# Patient Record
Sex: Female | Born: 1965 | Race: Asian | Hispanic: No | Marital: Married | Smoking: Never smoker
Health system: Southern US, Community
[De-identification: ages and names within clinical notes are randomized; demographics above are authoritative.]

## PROBLEM LIST (undated history)

## (undated) DIAGNOSIS — E039 Hypothyroidism, unspecified: Secondary | ICD-10-CM

## (undated) DIAGNOSIS — D649 Anemia, unspecified: Secondary | ICD-10-CM

## (undated) DIAGNOSIS — B019 Varicella without complication: Secondary | ICD-10-CM

## (undated) DIAGNOSIS — E785 Hyperlipidemia, unspecified: Secondary | ICD-10-CM

## (undated) DIAGNOSIS — Z87440 Personal history of urinary (tract) infections: Secondary | ICD-10-CM

## (undated) DIAGNOSIS — D569 Thalassemia, unspecified: Secondary | ICD-10-CM

## (undated) DIAGNOSIS — J45909 Unspecified asthma, uncomplicated: Secondary | ICD-10-CM

## (undated) DIAGNOSIS — Z9229 Personal history of other drug therapy: Secondary | ICD-10-CM

## (undated) HISTORY — DX: Varicella without complication: B01.9

## (undated) HISTORY — DX: Personal history of other drug therapy: Z92.29

## (undated) HISTORY — PX: WISDOM TOOTH EXTRACTION: SHX21

## (undated) HISTORY — DX: Thalassemia, unspecified: D56.9

## (undated) HISTORY — DX: Hyperlipidemia, unspecified: E78.5

## (undated) HISTORY — DX: Unspecified asthma, uncomplicated: J45.909

## (undated) HISTORY — DX: Personal history of urinary (tract) infections: Z87.440

## (undated) HISTORY — DX: Anemia, unspecified: D64.9

---

## 1998-06-29 DIAGNOSIS — E039 Hypothyroidism, unspecified: Secondary | ICD-10-CM

## 1998-06-29 HISTORY — DX: Hypothyroidism, unspecified: E03.9

## 1999-03-28 ENCOUNTER — Other Ambulatory Visit: Admission: RE | Admit: 1999-03-28 | Discharge: 1999-03-28 | Payer: Self-pay | Admitting: Obstetrics and Gynecology

## 2004-04-24 ENCOUNTER — Other Ambulatory Visit: Admission: RE | Admit: 2004-04-24 | Discharge: 2004-04-24 | Payer: Self-pay | Admitting: Obstetrics and Gynecology

## 2006-10-22 ENCOUNTER — Other Ambulatory Visit: Admission: RE | Admit: 2006-10-22 | Discharge: 2006-10-22 | Payer: Self-pay | Admitting: Family Medicine

## 2008-10-08 ENCOUNTER — Other Ambulatory Visit: Admission: RE | Admit: 2008-10-08 | Discharge: 2008-10-08 | Payer: Self-pay | Admitting: Family Medicine

## 2008-10-31 ENCOUNTER — Encounter: Admission: RE | Admit: 2008-10-31 | Discharge: 2008-10-31 | Payer: Self-pay | Admitting: General Practice

## 2009-12-31 ENCOUNTER — Encounter: Admission: RE | Admit: 2009-12-31 | Discharge: 2009-12-31 | Payer: Self-pay | Admitting: Family Medicine

## 2010-02-26 ENCOUNTER — Other Ambulatory Visit: Admission: RE | Admit: 2010-02-26 | Discharge: 2010-02-26 | Payer: Self-pay | Admitting: Family Medicine

## 2011-10-01 ENCOUNTER — Other Ambulatory Visit: Payer: Self-pay | Admitting: Family Medicine

## 2011-10-01 DIAGNOSIS — Z1231 Encounter for screening mammogram for malignant neoplasm of breast: Secondary | ICD-10-CM

## 2011-10-28 ENCOUNTER — Ambulatory Visit
Admission: RE | Admit: 2011-10-28 | Discharge: 2011-10-28 | Disposition: A | Discharge status: Home / Self Care | Payer: BC Managed Care – PPO | Source: Ambulatory Visit | Attending: Family Medicine | Admitting: Family Medicine

## 2011-10-28 DIAGNOSIS — Z1231 Encounter for screening mammogram for malignant neoplasm of breast: Secondary | ICD-10-CM

## 2013-05-15 ENCOUNTER — Other Ambulatory Visit: Payer: Self-pay

## 2013-05-15 DIAGNOSIS — Z1231 Encounter for screening mammogram for malignant neoplasm of breast: Secondary | ICD-10-CM

## 2013-06-15 ENCOUNTER — Ambulatory Visit
Admission: RE | Admit: 2013-06-15 | Discharge: 2013-06-15 | Disposition: A | Discharge status: Home / Self Care | Payer: BC Managed Care – PPO | Source: Ambulatory Visit

## 2013-06-15 DIAGNOSIS — Z1231 Encounter for screening mammogram for malignant neoplasm of breast: Secondary | ICD-10-CM

## 2013-12-12 ENCOUNTER — Other Ambulatory Visit: Payer: Self-pay | Admitting: Obstetrics and Gynecology

## 2014-01-30 ENCOUNTER — Other Ambulatory Visit: Payer: Self-pay | Admitting: Obstetrics and Gynecology

## 2014-01-30 DIAGNOSIS — R102 Pelvic and perineal pain: Secondary | ICD-10-CM

## 2014-01-31 ENCOUNTER — Ambulatory Visit
Admission: RE | Admit: 2014-01-31 | Discharge: 2014-01-31 | Disposition: A | Discharge status: Home / Self Care | Payer: BC Managed Care – PPO | Source: Ambulatory Visit | Attending: Obstetrics and Gynecology | Admitting: Obstetrics and Gynecology

## 2014-01-31 DIAGNOSIS — R102 Pelvic and perineal pain: Secondary | ICD-10-CM

## 2014-01-31 MED ORDER — IOHEXOL 300 MG/ML  SOLN
100.0000 mL | Freq: Once | INTRAMUSCULAR | Status: AC | PRN
  Administered 2014-01-31: 100 mL via INTRAVENOUS

## 2014-02-20 ENCOUNTER — Other Ambulatory Visit: Payer: Self-pay | Admitting: Obstetrics and Gynecology

## 2014-03-07 ENCOUNTER — Encounter (HOSPITAL_COMMUNITY): Payer: Self-pay | Admitting: Pharmacist

## 2014-03-13 ENCOUNTER — Other Ambulatory Visit: Payer: Self-pay | Admitting: Obstetrics and Gynecology

## 2014-03-14 ENCOUNTER — Encounter (HOSPITAL_COMMUNITY): Payer: Self-pay

## 2014-03-14 ENCOUNTER — Encounter (HOSPITAL_COMMUNITY)
Admission: RE | Admit: 2014-03-14 | Discharge: 2014-03-14 | Disposition: A | Discharge status: Home / Self Care | Payer: BC Managed Care – PPO | Source: Ambulatory Visit | Attending: Obstetrics and Gynecology | Admitting: Obstetrics and Gynecology

## 2014-03-14 DIAGNOSIS — N949 Unspecified condition associated with female genital organs and menstrual cycle: Secondary | ICD-10-CM | POA: Insufficient documentation

## 2014-03-14 DIAGNOSIS — N839 Noninflammatory disorder of ovary, fallopian tube and broad ligament, unspecified: Secondary | ICD-10-CM | POA: Diagnosis not present

## 2014-03-14 DIAGNOSIS — Z01812 Encounter for preprocedural laboratory examination: Secondary | ICD-10-CM | POA: Diagnosis present

## 2014-03-14 DIAGNOSIS — R971 Elevated cancer antigen 125 [CA 125]: Secondary | ICD-10-CM | POA: Diagnosis not present

## 2014-03-14 DIAGNOSIS — D259 Leiomyoma of uterus, unspecified: Secondary | ICD-10-CM | POA: Diagnosis not present

## 2014-03-14 DIAGNOSIS — N920 Excessive and frequent menstruation with regular cycle: Secondary | ICD-10-CM | POA: Insufficient documentation

## 2014-03-14 HISTORY — DX: Hypothyroidism, unspecified: E03.9

## 2014-03-14 LAB — CBC
HCT: 38.5 % (ref 36.0–46.0)
Hemoglobin: 12.6 g/dL (ref 12.0–15.0)
MCH: 21.2 pg — ABNORMAL LOW (ref 26.0–34.0)
MCHC: 32.7 g/dL (ref 30.0–36.0)
MCV: 64.9 fL — ABNORMAL LOW (ref 78.0–100.0)
PLATELETS: 228 10*3/uL (ref 150–400)
RBC: 5.93 MIL/uL — AB (ref 3.87–5.11)
RDW: 16.7 % — AB (ref 11.5–15.5)
WBC: 6.8 10*3/uL (ref 4.0–10.5)

## 2014-03-14 NOTE — Patient Instructions (Addendum)
   Your procedure is scheduled on: SEPT 23 2015 AT 10AM  Enter through the Main Entrance of [facility] at: SEPT 23 2015 AT 830AM Pick up the phone at the desk and dial [phone] and inform us of your arrival.  Please call this number if you have any problems the morning of surgery: [phone]  Remember: Do not eat food after midnight:SEPT 22 Do not drink clear liquids after:SEPT 22 Take these medicines the morning of surgery with a SIP OF WATER:  Do not wear jewelry, make-up, or FINGER nail polish No metal in your hair or on your body. Do not wear lotions, powders, perfumes.  You may wear deodorant.  Do not bring valuables to the hospital. Contacts, dentures or bridgework may not be worn into surgery.  Leave suitcase in the car. After Surgery it may be brought to your room. For patients being admitted to the hospital, checkout time is 11:00am the day of discharge.    Patients discharged on the day of surgery will not be allowed to drive home.

## 2014-03-16 ENCOUNTER — Other Ambulatory Visit (HOSPITAL_COMMUNITY): Payer: Self-pay | Admitting: Obstetrics and Gynecology

## 2014-03-16 NOTE — H&P (Signed)
Melanie Mills is a 48 y.o.  female G: 2, P 3-0-0-3 presents for hysterectomy because of symptomatic uterine fibroids, pelvic pain, menorrhagia and anemia.  Over the past year,  the patient's  7 day menstrual flow has become heavier with clots.  She changes her pad hourly and experiences  cramping rated at a 10/10 on a 10 point pain scale.  Fortunately she gets relief from her pain with Ibuprofen 600 mg (decreases to 4/10).  She feels fatigued, has lower back pain, inter-menstrual spotting, dyspareunia (such that she's abstained) and episodic urinary incontinence with certain positions.  She denies, hematuria, dysuria, frequency or changes in bowel function.  A sono-hysterogram in June 2015 showed a uterus:  8.36 x 7.22 x 6.96 cm with  #3 fibroids:  anterior: 4.1 x 2.9 x 4.1 cm with a sub-mucosal component  2.7 x 1.6 x 1.9 cm;   posterior: 3.0 x 2.7 cm and  anterior2.5 x 2.6 x 2.6 cm.. Right ovary was not visualized and left ovary measured  3.01 x 2.44 x 2.50 cm.  A CT of pelvis done in August 2015 for pelvic pain was consistent with the previous ultrasound except an enlarged right ovary,  with evidence of inflammation measuring 5.2 x 4.0 x 5.1 cm and containing an  internal cystic lesion measuring 3.4 x 3.2 cm favoring a functional vs hemorrhagic cyst. After a review of both medical and surgical management options for her symptoms and findings,  the patient has decided to proceed with definitive therapy in the form of hysterectomy.  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  ROS: Admits to glasses and rare leaking of urine;  Denies headache, vision changes, nasal congestion, dysphagia, tinnitus, dizziness, hoarseness, cough,  chest pain, shortness of breath, nausea, vomiting, diarrhea,constipation,  urinary frequency, urgency  dysuria, hematuria, vaginitis symptoms, swelling of joints,easy bruising,  myalgias, arthralgias, skin rashes, unexplained weight loss and except as is mentioned in the history of present illness, patient's review of systems is otherwise negative.   Physical Exam  Bp: 100/72  P: 84  R: 16  Temperature: 98.7 degrees F orally  Weight: 182 lbs. Height: 5\' 3"   BMI: 32.2  Neck: supple without masses or thyromegaly Lungs: clear to auscultation Heart: regular rate and rhythm Abdomen: soft, non-tender, firm mass from pelvis to approximately 4 fingers above symphysis pubis  and no organomegaly Pelvic:EGBUS- wnl; vagina-normal rugae; uterus-12-14 weeks size, irregular, tender;  cervix without lesions or motion tenderness; adnexae-no tenderness or masses Extremities:  no clubbing, cyanosis or edema   Assesment:  Symptomatic Fibroids             Menorrhagia  Pelvic Pain             Anemia                        Right Ovarian Cyst   Disposition:  Reviewed the risks of surgery to include, but not limited to: reaction to anesthesia, damage to adjacent organs, infection,  excessive bleeding and if ovaries are removed menopausal symptoms.  The patient verbalized understanding of these risks and has consented to proceed with a Total Abdominal Hysterectomy,  Bilateral Salpingectomy, Possible Bilateral or Unilateral Oophorectomy and Possible Right Ovarian Cystectomy at [facility] on March 21, 2014 at 10 a.m.  CSN# [id number]   Staceyann Knouff J. Florene Glen, PA-C  for Dr. Seymour Bars. Haygood

## 2014-03-20 MED ORDER — DEXTROSE 5 % IV SOLN
2.0000 g | INTRAVENOUS | Status: AC
  Administered 2014-03-21: 2 g via INTRAVENOUS
  Filled 2014-03-20: qty 2

## 2014-03-21 ENCOUNTER — Inpatient Hospital Stay (HOSPITAL_COMMUNITY): Payer: BC Managed Care – PPO | Admitting: Anesthesiology

## 2014-03-21 ENCOUNTER — Encounter (HOSPITAL_COMMUNITY): Payer: Self-pay | Admitting: Anesthesiology

## 2014-03-21 ENCOUNTER — Encounter (HOSPITAL_COMMUNITY): Payer: BC Managed Care – PPO | Admitting: Anesthesiology

## 2014-03-21 ENCOUNTER — Encounter (HOSPITAL_COMMUNITY)
Admission: RE | Disposition: A | Discharge status: Home / Self Care | Payer: Self-pay | Source: Ambulatory Visit | Attending: Obstetrics and Gynecology

## 2014-03-21 ENCOUNTER — Inpatient Hospital Stay (HOSPITAL_COMMUNITY)
Admission: RE | Admit: 2014-03-21 | Discharge: 2014-03-23 | DRG: 743 | Disposition: A | Discharge status: Home / Self Care | Payer: BC Managed Care – PPO | Source: Ambulatory Visit | Attending: Obstetrics and Gynecology | Admitting: Obstetrics and Gynecology

## 2014-03-21 DIAGNOSIS — D251 Intramural leiomyoma of uterus: Secondary | ICD-10-CM | POA: Diagnosis present

## 2014-03-21 DIAGNOSIS — D500 Iron deficiency anemia secondary to blood loss (chronic): Secondary | ICD-10-CM | POA: Diagnosis present

## 2014-03-21 DIAGNOSIS — N802 Endometriosis of fallopian tube: Secondary | ICD-10-CM | POA: Diagnosis present

## 2014-03-21 DIAGNOSIS — D509 Iron deficiency anemia, unspecified: Secondary | ICD-10-CM | POA: Diagnosis not present

## 2014-03-21 DIAGNOSIS — N83201 Unspecified ovarian cyst, right side: Secondary | ICD-10-CM | POA: Diagnosis present

## 2014-03-21 DIAGNOSIS — N838 Other noninflammatory disorders of ovary, fallopian tube and broad ligament: Secondary | ICD-10-CM | POA: Diagnosis present

## 2014-03-21 DIAGNOSIS — N839 Noninflammatory disorder of ovary, fallopian tube and broad ligament, unspecified: Secondary | ICD-10-CM | POA: Diagnosis present

## 2014-03-21 DIAGNOSIS — N949 Unspecified condition associated with female genital organs and menstrual cycle: Secondary | ICD-10-CM | POA: Diagnosis present

## 2014-03-21 DIAGNOSIS — N80209 Endometriosis of unspecified fallopian tube, unspecified depth: Secondary | ICD-10-CM | POA: Diagnosis present

## 2014-03-21 DIAGNOSIS — N831 Corpus luteum cyst of ovary, unspecified side: Secondary | ICD-10-CM | POA: Diagnosis present

## 2014-03-21 DIAGNOSIS — N840 Polyp of corpus uteri: Secondary | ICD-10-CM | POA: Diagnosis present

## 2014-03-21 DIAGNOSIS — R971 Elevated cancer antigen 125 [CA 125]: Secondary | ICD-10-CM | POA: Diagnosis present

## 2014-03-21 DIAGNOSIS — N920 Excessive and frequent menstruation with regular cycle: Secondary | ICD-10-CM | POA: Diagnosis present

## 2014-03-21 DIAGNOSIS — D62 Acute posthemorrhagic anemia: Secondary | ICD-10-CM | POA: Diagnosis not present

## 2014-03-21 DIAGNOSIS — D219 Benign neoplasm of connective and other soft tissue, unspecified: Secondary | ICD-10-CM | POA: Diagnosis present

## 2014-03-21 HISTORY — DX: Intramural leiomyoma of uterus: D25.1

## 2014-03-21 HISTORY — DX: Unspecified ovarian cyst, right side: N83.201

## 2014-03-21 HISTORY — PX: ABDOMINAL HYSTERECTOMY: SHX81

## 2014-03-21 HISTORY — PX: BILATERAL SALPINGOOPHORECTOMY: SHX1223

## 2014-03-21 LAB — TYPE AND SCREEN
ABO/RH(D): A POS
Antibody Screen: NEGATIVE

## 2014-03-21 LAB — PREGNANCY, URINE: Preg Test, Ur: NEGATIVE

## 2014-03-21 LAB — ABO/RH: ABO/RH(D): A POS

## 2014-03-21 SURGERY — HYSTERECTOMY, ABDOMINAL
Anesthesia: General | Site: Abdomen

## 2014-03-21 MED ORDER — SCOPOLAMINE 1 MG/3DAYS TD PT72
1.0000 | MEDICATED_PATCH | Freq: Once | TRANSDERMAL | Status: DC
  Administered 2014-03-21: 1.5 mg via TRANSDERMAL

## 2014-03-21 MED ORDER — ONDANSETRON HCL 4 MG PO TABS: 4.0000 mg | ORAL_TABLET | Freq: Three times a day (TID) | ORAL | Status: DC | PRN

## 2014-03-21 MED ORDER — SODIUM CHLORIDE 0.9 % IJ SOLN: 9.0000 mL | INTRAMUSCULAR | Status: DC | PRN

## 2014-03-21 MED ORDER — HYDROMORPHONE 0.3 MG/ML IV SOLN
INTRAVENOUS | Status: DC
  Administered 2014-03-21: 0.9 mg via INTRAVENOUS
  Administered 2014-03-21: 16:00:00 via INTRAVENOUS
  Administered 2014-03-21: 0.8 mg via INTRAVENOUS
  Administered 2014-03-22: 2.1 mg via INTRAVENOUS
  Administered 2014-03-22: 3 mL via INTRAVENOUS
  Administered 2014-03-22: 0.9 mg via INTRAVENOUS
  Filled 2014-03-21: qty 25

## 2014-03-21 MED ORDER — HYDROMORPHONE HCL 1 MG/ML IJ SOLN
INTRAMUSCULAR | Status: AC
  Filled 2014-03-21: qty 1

## 2014-03-21 MED ORDER — NEOSTIGMINE METHYLSULFATE 10 MG/10ML IV SOLN
INTRAVENOUS | Status: DC | PRN
  Administered 2014-03-21: 4 mg via INTRAVENOUS

## 2014-03-21 MED ORDER — MIDAZOLAM HCL 2 MG/2ML IJ SOLN
INTRAMUSCULAR | Status: DC | PRN
  Administered 2014-03-21: 2 mg via INTRAVENOUS

## 2014-03-21 MED ORDER — FENTANYL CITRATE 0.05 MG/ML IJ SOLN
INTRAMUSCULAR | Status: AC
  Filled 2014-03-21: qty 2

## 2014-03-21 MED ORDER — HEPARIN SODIUM (PORCINE) 5000 UNIT/ML IJ SOLN
INTRAMUSCULAR | Status: DC | PRN
  Administered 2014-03-21: 5000 [IU] via SUBCUTANEOUS

## 2014-03-21 MED ORDER — ROCURONIUM BROMIDE 100 MG/10ML IV SOLN
INTRAVENOUS | Status: AC
  Filled 2014-03-21: qty 1

## 2014-03-21 MED ORDER — HYDROMORPHONE HCL 1 MG/ML IJ SOLN
INTRAMUSCULAR | Status: DC | PRN
  Administered 2014-03-21 (×2): 1 mg via INTRAVENOUS

## 2014-03-21 MED ORDER — BUPIVACAINE HCL (PF) 0.25 % IJ SOLN
INTRAMUSCULAR | Status: AC
  Filled 2014-03-21: qty 30

## 2014-03-21 MED ORDER — GLYCOPYRROLATE 0.2 MG/ML IJ SOLN
INTRAMUSCULAR | Status: AC
  Filled 2014-03-21: qty 1

## 2014-03-21 MED ORDER — ONDANSETRON HCL 4 MG/2ML IJ SOLN: 4.0000 mg | Freq: Four times a day (QID) | INTRAMUSCULAR | Status: DC | PRN

## 2014-03-21 MED ORDER — IBUPROFEN 600 MG PO TABS
600.0000 mg | ORAL_TABLET | Freq: Four times a day (QID) | ORAL | Status: DC | PRN
  Administered 2014-03-22 – 2014-03-23 (×4): 600 mg via ORAL
  Filled 2014-03-21 (×4): qty 1

## 2014-03-21 MED ORDER — ROCURONIUM BROMIDE 100 MG/10ML IV SOLN
INTRAVENOUS | Status: DC | PRN
  Administered 2014-03-21: 10 mg via INTRAVENOUS
  Administered 2014-03-21: 50 mg via INTRAVENOUS
  Administered 2014-03-21: 20 mg via INTRAVENOUS
  Administered 2014-03-21 (×3): 10 mg via INTRAVENOUS

## 2014-03-21 MED ORDER — MENTHOL 3 MG MT LOZG
1.0000 | LOZENGE | OROMUCOSAL | Status: DC | PRN
  Filled 2014-03-21: qty 9

## 2014-03-21 MED ORDER — NALOXONE HCL 0.4 MG/ML IJ SOLN: 0.4000 mg | INTRAMUSCULAR | Status: DC | PRN

## 2014-03-21 MED ORDER — OXYCODONE-ACETAMINOPHEN 5-325 MG PO TABS
1.0000 | ORAL_TABLET | ORAL | Status: DC | PRN
  Administered 2014-03-22 – 2014-03-23 (×5): 1 via ORAL
  Filled 2014-03-21 (×5): qty 1

## 2014-03-21 MED ORDER — PROPOFOL 10 MG/ML IV BOLUS
INTRAVENOUS | Status: DC | PRN
  Administered 2014-03-21: 30 mg via INTRAVENOUS
  Administered 2014-03-21: 150 mg via INTRAVENOUS

## 2014-03-21 MED ORDER — DIPHENHYDRAMINE HCL 50 MG/ML IJ SOLN: 12.5000 mg | Freq: Four times a day (QID) | INTRAMUSCULAR | Status: DC | PRN

## 2014-03-21 MED ORDER — DOCUSATE SODIUM 100 MG PO CAPS
100.0000 mg | ORAL_CAPSULE | Freq: Two times a day (BID) | ORAL | Status: DC
  Administered 2014-03-22 – 2014-03-23 (×3): 100 mg via ORAL
  Filled 2014-03-21 (×3): qty 1

## 2014-03-21 MED ORDER — GLYCOPYRROLATE 0.2 MG/ML IJ SOLN
INTRAMUSCULAR | Status: AC
  Filled 2014-03-21: qty 4

## 2014-03-21 MED ORDER — FENTANYL CITRATE 0.05 MG/ML IJ SOLN
INTRAMUSCULAR | Status: AC
  Filled 2014-03-21: qty 5

## 2014-03-21 MED ORDER — GLYCOPYRROLATE 0.2 MG/ML IJ SOLN
INTRAMUSCULAR | Status: DC | PRN
  Administered 2014-03-21: .7 mg via INTRAVENOUS
  Administered 2014-03-21: 0.1 mg via INTRAVENOUS

## 2014-03-21 MED ORDER — HYDROMORPHONE HCL 1 MG/ML IJ SOLN
0.2500 mg | INTRAMUSCULAR | Status: DC | PRN
  Administered 2014-03-21 (×4): 0.25 mg via INTRAVENOUS

## 2014-03-21 MED ORDER — LACTATED RINGERS IV SOLN
INTRAVENOUS | Status: DC
  Administered 2014-03-22 (×2): via INTRAVENOUS

## 2014-03-21 MED ORDER — DEXAMETHASONE SODIUM PHOSPHATE 10 MG/ML IJ SOLN
INTRAMUSCULAR | Status: DC | PRN
  Administered 2014-03-21: 4 mg via INTRAVENOUS

## 2014-03-21 MED ORDER — ONDANSETRON HCL 4 MG/2ML IJ SOLN
INTRAMUSCULAR | Status: DC | PRN
  Administered 2014-03-21: 4 mg via INTRAVENOUS

## 2014-03-21 MED ORDER — MIDAZOLAM HCL 2 MG/2ML IJ SOLN
INTRAMUSCULAR | Status: AC
  Filled 2014-03-21: qty 2

## 2014-03-21 MED ORDER — PROPOFOL 10 MG/ML IV EMUL
INTRAVENOUS | Status: AC
  Filled 2014-03-21: qty 20

## 2014-03-21 MED ORDER — FENTANYL CITRATE 0.05 MG/ML IJ SOLN
INTRAMUSCULAR | Status: DC | PRN
  Administered 2014-03-21: 50 ug via INTRAVENOUS
  Administered 2014-03-21: 100 ug via INTRAVENOUS
  Administered 2014-03-21: 50 ug via INTRAVENOUS
  Administered 2014-03-21 (×2): 100 ug via INTRAVENOUS
  Administered 2014-03-21: 50 ug via INTRAVENOUS

## 2014-03-21 MED ORDER — ONDANSETRON HCL 4 MG/2ML IJ SOLN
INTRAMUSCULAR | Status: AC
  Filled 2014-03-21: qty 2

## 2014-03-21 MED ORDER — KETOROLAC TROMETHAMINE 30 MG/ML IJ SOLN
30.0000 mg | Freq: Four times a day (QID) | INTRAMUSCULAR | Status: AC
  Administered 2014-03-21 – 2014-03-22 (×3): 30 mg via INTRAVENOUS
  Filled 2014-03-21 (×3): qty 1

## 2014-03-21 MED ORDER — DEXAMETHASONE SODIUM PHOSPHATE 10 MG/ML IJ SOLN
INTRAMUSCULAR | Status: AC
  Filled 2014-03-21: qty 1

## 2014-03-21 MED ORDER — HYDROMORPHONE HCL 1 MG/ML IJ SOLN
INTRAMUSCULAR | Status: AC
  Administered 2014-03-21: 0.25 mg via INTRAVENOUS
  Filled 2014-03-21: qty 1

## 2014-03-21 MED ORDER — LIDOCAINE HCL (CARDIAC) 20 MG/ML IV SOLN
INTRAVENOUS | Status: AC
  Filled 2014-03-21: qty 5

## 2014-03-21 MED ORDER — LACTATED RINGERS IV BOLUS (SEPSIS)
1000.0000 mL | Freq: Once | INTRAVENOUS | Status: AC
  Administered 2014-03-21: 1000 mL via INTRAVENOUS

## 2014-03-21 MED ORDER — LEVOTHYROXINE SODIUM 175 MCG PO TABS
175.0000 ug | ORAL_TABLET | Freq: Every day | ORAL | Status: DC
  Administered 2014-03-22 – 2014-03-23 (×2): 175 ug via ORAL
  Filled 2014-03-21 (×2): qty 1

## 2014-03-21 MED ORDER — KETOROLAC TROMETHAMINE 30 MG/ML IJ SOLN
INTRAMUSCULAR | Status: DC | PRN
  Administered 2014-03-21: 30 mg via INTRAVENOUS

## 2014-03-21 MED ORDER — LACTATED RINGERS IV SOLN
INTRAVENOUS | Status: DC
  Administered 2014-03-21 (×5): via INTRAVENOUS

## 2014-03-21 MED ORDER — BUPIVACAINE HCL (PF) 0.25 % IJ SOLN
INTRAMUSCULAR | Status: DC | PRN
  Administered 2014-03-21: 10 mL

## 2014-03-21 MED ORDER — DIPHENHYDRAMINE HCL 12.5 MG/5ML PO ELIX: 12.5000 mg | ORAL_SOLUTION | Freq: Four times a day (QID) | ORAL | Status: DC | PRN

## 2014-03-21 MED ORDER — NEOSTIGMINE METHYLSULFATE 10 MG/10ML IV SOLN
INTRAVENOUS | Status: AC
  Filled 2014-03-21: qty 1

## 2014-03-21 MED ORDER — SCOPOLAMINE 1 MG/3DAYS TD PT72
MEDICATED_PATCH | TRANSDERMAL | Status: AC
  Filled 2014-03-21: qty 1

## 2014-03-21 MED ORDER — HEPARIN SODIUM (PORCINE) 5000 UNIT/ML IJ SOLN
INTRAMUSCULAR | Status: AC
  Filled 2014-03-21: qty 1

## 2014-03-21 SURGICAL SUPPLY — 57 items
ADH SKN CLS APL DERMABOND .7 (GAUZE/BANDAGES/DRESSINGS) ×1
BLADE SURG 10 STRL SS (BLADE) ×2 IMPLANT
CANISTER SUCT 3000ML (MISCELLANEOUS) ×2 IMPLANT
CATH ROBINSON RED A/P 16FR (CATHETERS) IMPLANT
CLOTH BEACON ORANGE TIMEOUT ST (SAFETY) ×2 IMPLANT
CONT PATH 16OZ SNAP LID 3702 (MISCELLANEOUS) ×2 IMPLANT
CONT SPECI 4OZ STER CLIK (MISCELLANEOUS) ×1 IMPLANT
COVER LIGHT HANDLE  1/PK (MISCELLANEOUS) ×1
COVER LIGHT HANDLE 1/PK (MISCELLANEOUS) IMPLANT
DECANTER SPIKE VIAL GLASS SM (MISCELLANEOUS) IMPLANT
DERMABOND ADVANCED (GAUZE/BANDAGES/DRESSINGS) ×1
DERMABOND ADVANCED .7 DNX12 (GAUZE/BANDAGES/DRESSINGS) IMPLANT
DRAIN JACKSON PRT FLT 7MM (DRAIN) IMPLANT
DRAPE WARM FLUID 44X44 (DRAPE) ×1 IMPLANT
DRSG OPSITE POSTOP 4X10 (GAUZE/BANDAGES/DRESSINGS) ×2 IMPLANT
DURAPREP 26ML APPLICATOR (WOUND CARE) ×2 IMPLANT
ELECT BLADE 6.5 EXT (BLADE) ×1 IMPLANT
ELECT CAUTERY BLADE 6.4 (BLADE) ×1 IMPLANT
ELECT NDL TIP 2.8 STRL (NEEDLE) IMPLANT
ELECT NEEDLE TIP 2.8 STRL (NEEDLE) IMPLANT
EVACUATOR SILICONE 100CC (DRAIN) IMPLANT
GAUZE SPONGE 4X4 16PLY XRAY LF (GAUZE/BANDAGES/DRESSINGS) ×2 IMPLANT
GLOVE SURG SS PI 6.5 STRL IVOR (GLOVE) ×4 IMPLANT
GOWN STRL REUS W/TWL LRG LVL3 (GOWN DISPOSABLE) ×6 IMPLANT
NDL HYPO 25X1 1.5 SAFETY (NEEDLE) IMPLANT
NDL SPNL 22GX3.5 QUINCKE BK (NEEDLE) ×1 IMPLANT
NEEDLE HYPO 25X1 1.5 SAFETY (NEEDLE) ×2 IMPLANT
NEEDLE SPNL 22GX3.5 QUINCKE BK (NEEDLE) ×2 IMPLANT
NS IRRIG 1000ML POUR BTL (IV SOLUTION) ×3 IMPLANT
PACK ABDOMINAL GYN (CUSTOM PROCEDURE TRAY) ×2 IMPLANT
PAD ABD 7.5X8 STRL (GAUZE/BANDAGES/DRESSINGS) ×1 IMPLANT
PAD OB MATERNITY 4.3X12.25 (PERSONAL CARE ITEMS) ×2 IMPLANT
PROTECTOR NERVE ULNAR (MISCELLANEOUS) ×2 IMPLANT
SPONGE GAUZE 4X4 12PLY STER LF (GAUZE/BANDAGES/DRESSINGS) ×1 IMPLANT
SPONGE LAP 18X18 X RAY DECT (DISPOSABLE) ×5 IMPLANT
STAPLER VISISTAT 35W (STAPLE) IMPLANT
SUT MNCRL AB 3-0 PS2 27 (SUTURE) ×1 IMPLANT
SUT PDS AB 1 CT  36 (SUTURE)
SUT PDS AB 1 CT 36 (SUTURE) IMPLANT
SUT PLAIN 2 0 XLH (SUTURE) IMPLANT
SUT SILK 0 FSL (SUTURE) IMPLANT
SUT VIC AB 0 CT1 18XCR BRD8 (SUTURE) ×3 IMPLANT
SUT VIC AB 0 CT1 27 (SUTURE) ×2
SUT VIC AB 0 CT1 27XBRD ANBCTR (SUTURE) IMPLANT
SUT VIC AB 0 CT1 27XCR 8 STRN (SUTURE) ×1 IMPLANT
SUT VIC AB 0 CT1 8-18 (SUTURE) ×6
SUT VIC AB 2-0 CT1 (SUTURE) ×2 IMPLANT
SUT VIC AB 3-0 SH 27 (SUTURE)
SUT VIC AB 3-0 SH 27X BRD (SUTURE) IMPLANT
SUT VICRYL 0 TIES 12 18 (SUTURE) ×2 IMPLANT
SYR 50ML LL SCALE MARK (SYRINGE) ×1 IMPLANT
SYR CONTROL 10ML LL (SYRINGE) ×1 IMPLANT
SYR TB 1ML 25GX5/8 (SYRINGE) IMPLANT
TAPE CLOTH SURG 4X10 WHT LF (GAUZE/BANDAGES/DRESSINGS) ×1 IMPLANT
TOWEL OR 17X24 6PK STRL BLUE (TOWEL DISPOSABLE) ×4 IMPLANT
TRAY FOLEY CATH 14FR (SET/KITS/TRAYS/PACK) ×2 IMPLANT
WATER STERILE IRR 1000ML POUR (IV SOLUTION) ×2 IMPLANT

## 2014-03-21 NOTE — Op Note (Signed)
03/21/2014  1:23 PM  PATIENT:  Melanie Mills  48 y.o. female MRN:  [id number]  PRE-OPERATIVE DIAGNOSIS:  Fibroids, Menorrhagia, Pelvic Pain, Right Ovarian mass on ultrasound, Elevated Ca125  POST-OPERATIVE DIAGNOSIS:  Fibroids, Menorrhagia, Pelvic Pain, Right Tubal mass with no evidence of malignancy on frozen section.  Possible endometriosis  PROCEDURE:  Procedure(s): Total abdominal hysterectomy and bilateral salpingo-oophorectomy with frozen section on the right ovary and right fallopian tubes   SURGEON:  Surgeon(s): Eldred Manges, MD  ASSISTANTS:  Earnstine Regal certified physician Asst.   ANESTHESIA:  Choice  ESTIMATED BLOOD LOSS: * No blood loss amount entered *   COMPLICATIONS: None  BLOOD ADMINISTERED:none  DRAINS: none   LOCAL MEDICATIONS USED:  MARCAINE    and Amount: 20 cc ml  SPECIMEN:  Source of Specimen:  Right tube and ovary sent for frozen section.  Uterus, cervix, left tube and ovary  DISPOSITION OF SPECIMEN:  PATHOLOGY  COUNTS:  YES  FINDINGS:  The uterus was enlarged to approximately 14 weeks' size with multiple myomata. The left ovary and fallopian tube were within normal limits. The right ovary was within normal limits. The right tube contained at its fimbriated end. A 3 cm firm mass that seemed to be dressing from the fimbriated end. Peer. There were no specific excrescences however, it was slightly adherent to the overlying peritoneal sidewall. There were several areas. At the cornual region and on the anterior abdominal peritoneum that were consistent with powder burns most reminiscent of endometriosis.  PROCEDURE: The patient was taken to the operating room after appropriate identification and placed on the operating table in the supine position. Equipment for the induction of general anesthesia was placed and after the attainment of adequate general anesthesia the abdomen, perineum, and vagina were prepped with multiple layers of Betadine.a Foley  catheter was inserted into the bladder under sterile conditions and connected to straight drainage. The abdomen was draped as a sterile field.  Suprapubic injection of 10 cc of quarter percent Marcaine was undertaken. A suprapubic incision was made and the abdomen opened in layers. Peritoneum was entered and a self-retaining retractor placed in the abdominal cavity. Pelvic washings were obtained from all quadrants of the peritoneal cavity. Exploration of the abdomen and pelvis revealed that the right adnexal firm 3 cm mass which on inspection was located at the right tubal fimbria. The uterus was enlarged with multiple myomata. The right and left ovaries appeared normal. The left tube appeared normal, but was adherent near the fimbriated into the underlying descending colon.  A bladder blade was placed and the bowel was packed cephalad. After visual and palpable intraperitoneal evaluation of the anatomy was carried out large Kelly clamps were placed at the cornual regions of the uterus. The right round ligament was then identified clamped cut and suture ligated. That incision was taken anteriorly on the anterior leaf of the broad ligament. The infundibulo-pelvic ligament was identified clamped cut, free tied and suture-ligated . After clear identification of the right ureter. The utero-ovarian ligament was then clamped, cut, and suture ligated, and the right tube and ovary removed from the operative field and sent for frozen section.  A similar procedure was carried out on the opposite side with the round ligament and utero-ovarian ligament. The bladder flap was completed on the opposite side with incision of the anterior leaf of the broad ligament. The bladder was dissected off the anterior cervix with a combination of blunt and sharp dissection. The uterine arteries on the right  and left side were clamped cut and suture ligated.  Parametial tissues on the right and left side were clamped, cut and  suture-ligated down to the level of the cervix. The cervical tissues were then clamped cut and suture ligated. Uterosacral ligaments were clamped cut suture-ligated and those sutures held. The vaginal angles were clamped cut suture ligated and the sutures held. The remainder of the vagina was incised and the uterus,and cervix were removed from the operative field. Vaginal cuff was closed with figure-of-eight sutures of 0 Vicryl. Copious irrigation was carried out. The sutures holding the vaginal angles and uterosacral ligaments were then tied together. Hemostasis was noted to be adequate.   There were areas on the right cornual region that were consistent with endometriosis per the frozen section returned as a benign finding with a question of endometrial glands, but no endometrial stroma, so that the definitive diagnosis of endometriosis could not be made. In light of that possibility. A decision was made to remove the left tube and ovary. The infundibulopelvic ligament on the left side was isolated, clamped, cut, and suture ligated, allowing the left tube and ovary to be removed from the operative field.  All instruments were removed from the peritoneal cavity.  The abdominal peritoneum was closed using running suture of 2-0 Vicryl.  The rectus fascia was closed with running sutures of 0 Vicryl from each apex to  the midline and tied in the midline. The subcutaneous tissue was made hemostatic with Bovie cautery and irrigated. The skin incision was closed with a subcuticular suture of 3-0 Monocryl. Dermabond and a Sterile dressing were applied. The patient was awakened from general anesthesia and taken to the recovery room in satisfactory condition having tolerated the procedure well with  sponge and instrument counts correct.  PLAN OF CARE: Admit after postanesthesia care  PATIENT DISPOSITION:  PACU - hemodynamically stable.   Delay start of Pharmacological VTE agent (>24hrs) due to surgical blood loss or  risk of bleeding:  Yes.  SCD hose were used throughout the case and will be continued postoperatively   HAYGOOD,VANESSA P 1:23 PM

## 2014-03-21 NOTE — Transfer of Care (Signed)
Immediate Anesthesia Transfer of Care Note  Patient: Melanie Mills  Procedure(s) Performed: Procedure(s): Abdominal Hysterectomy, Bilateral Salpingectomy,Bilateralt Oopherectomies, excision of Right Tubal Mass  possible Left Oopherectomy  (N/A)  Patient Location: PACU  Anesthesia Type:General  Level of Consciousness: awake, alert  and oriented  Airway & Oxygen Therapy: Patient Spontanous Breathing and Patient connected to nasal cannula oxygen  Post-op Assessment: Report given to PACU RN and Post -op Vital signs reviewed and stable  Post vital signs: Reviewed and stable  Complications: No apparent anesthesia complications

## 2014-03-21 NOTE — Anesthesia Preprocedure Evaluation (Signed)

## 2014-03-21 NOTE — Anesthesia Procedure Notes (Signed)
Procedure Name: Intubation Date/Time: 03/21/2014 10:09 AM Performed by: Flossie Dibble Pre-anesthesia Checklist: Patient identified, Timeout performed, Emergency Drugs available, Suction available and Patient being monitored Patient Re-evaluated:Patient Re-evaluated prior to inductionOxygen Delivery Method: Circle system utilized Preoxygenation: Pre-oxygenation with 100% oxygen Intubation Type: IV induction and Combination inhalational/ intravenous induction Ventilation: Oral airway inserted - appropriate to patient size and Mask ventilation without difficulty Tube size: 7.0 mm Number of attempts: 1 (Intubated by Seward Speck MD) Airway Equipment and Method: Lighted stylet Secured at: 21 cm Tube secured with: Tape Dental Injury: Teeth and Oropharynx as per pre-operative assessment

## 2014-03-21 NOTE — Progress Notes (Signed)
Ms. Cheney Gosch is a 48 y.o. year old female.  Subjective:  Adequate pain control. Some itching. No nausea.  Objective:  BP 94/58  Pulse 78  Temp(Src) 98.3 F (36.8 C) (Oral)  Resp 21  Ht 5\' 3"  (1.6 m)  Wt 180 lb (81.647 kg)  BMI 31.89 kg/m2  SpO2 97%   CBC    Component Value Date/Time   WBC 6.8 03/14/2014 1340   RBC 5.93* 03/14/2014 1340   HGB 12.6 03/14/2014 1340   HCT 38.5 03/14/2014 1340   PLT 228 03/14/2014 1340   MCV 64.9* 03/14/2014 1340   MCH 21.2* 03/14/2014 1340   MCHC 32.7 03/14/2014 1340   RDW 16.7* 03/14/2014 1340     Chest: Clear Heart: RRR Abd: soft, appropriately tender, BS distant Ext: WNL Bleeding: scant blood on pad  Output: 100 cc's over the last 3.5 hours  Assessment:  PO Day 0 TAH Decreased urine output  Plan:  Fluid bolus. Routine post op care.  Gildardo Cranker M.D.  03/21/14  7:21 PM

## 2014-03-21 NOTE — H&P (Signed)
  History and Physical Interval Note:   03/21/2014   9:38 AM   Melanie Mills  has presented today for surgery, with the diagnosis of Fibroids, Menorrhagia, Pelvic Pain, Enlarged Ovary  The various methods of treatment have been discussed with the patient and family. After consideration of risks, benefits and other options for treatment, the patient has consented to  Procedure(s): HYSTERECTOMY ABDOMINAL, bilateral salpingectomy, right oophorectomy, possible left oophorectomy as a surgical intervention .  I have reviewed the patients' chart and labs.  Questions were answered to the patient's satisfaction and she agrees to the above procedure.   Eldred Manges  MD

## 2014-03-22 ENCOUNTER — Encounter (HOSPITAL_COMMUNITY): Payer: Self-pay | Admitting: Obstetrics and Gynecology

## 2014-03-22 DIAGNOSIS — N839 Noninflammatory disorder of ovary, fallopian tube and broad ligament, unspecified: Secondary | ICD-10-CM | POA: Diagnosis present

## 2014-03-22 DIAGNOSIS — N80209 Endometriosis of unspecified fallopian tube, unspecified depth: Secondary | ICD-10-CM

## 2014-03-22 DIAGNOSIS — N802 Endometriosis of fallopian tube: Secondary | ICD-10-CM | POA: Diagnosis present

## 2014-03-22 DIAGNOSIS — D62 Acute posthemorrhagic anemia: Secondary | ICD-10-CM | POA: Diagnosis not present

## 2014-03-22 HISTORY — DX: Endometriosis of fallopian tube: N80.2

## 2014-03-22 HISTORY — DX: Endometriosis of unspecified fallopian tube, unspecified depth: N80.209

## 2014-03-22 LAB — CBC
HEMATOCRIT: 27.2 % — AB (ref 36.0–46.0)
HEMOGLOBIN: 8.7 g/dL — AB (ref 12.0–15.0)
MCH: 21.1 pg — ABNORMAL LOW (ref 26.0–34.0)
MCHC: 32 g/dL (ref 30.0–36.0)
MCV: 65.9 fL — AB (ref 78.0–100.0)
Platelets: 172 10*3/uL (ref 150–400)
RBC: 4.13 MIL/uL (ref 3.87–5.11)
RDW: 16.1 % — AB (ref 11.5–15.5)
WBC: 8.9 10*3/uL (ref 4.0–10.5)

## 2014-03-22 MED FILL — Heparin Sodium (Porcine) Inj 5000 Unit/ML: INTRAMUSCULAR | Qty: 1 | Status: AC

## 2014-03-22 NOTE — Addendum Note (Signed)
Addendum created 03/22/14 1450 by Lyndle Herrlich, MD   Modules edited: Notes Section   Notes Section:  File: [id number]

## 2014-03-22 NOTE — Progress Notes (Signed)
Ur chart review completed.  

## 2014-03-22 NOTE — Anesthesia Postprocedure Evaluation (Signed)
  Anesthesia Post-op Note  Patient: Melanie Mills  Procedure(s) Performed: Procedure(s): Abdominal Hysterectomy, Bilateral Salpingectomy,Bilateralt Oopherectomies, excision of Right Tubal Mass  possible Left Oopherectomy  (N/A) Patient is awake and responsive. Pain and nausea are reasonably well controlled. Vital signs are stable and clinically acceptable. Oxygen saturation is clinically acceptable. There are no apparent anesthetic complications at this time. Patient is ready for discharge.

## 2014-03-22 NOTE — Progress Notes (Signed)
Melanie Mills is a50 y.o.  [id number]  Post Op Date # 1:  TAH/BSO/Excision of Right Tubal Mass  Subjective: Patient is Doing well postoperatively. Patient has Pain is controlled with current analgesics. Medications being used: prescription NSAID's including ketorolac (Toradol) and narcotic analgesics including hydromorphone (Dilaudid)., ambulated last night twice with dizziness only during first walk,  tolerated a tuna sandwich but hasn't voided since Foley has been removed.    Objective: Vital signs in last 24 hours: Temp:  [98.1 F (36.7 C)-99.3 F (37.4 C)] 98.1 F (36.7 C) (09/24 0443) Pulse Rate:  [57-89] 63 (09/24 0443) Resp:  [10-24] 22 (09/24 0449) BP: (85-127)/(45-91) 85/46 mmHg (09/24 0443) SpO2:  [96 %-100 %] 96 % (09/24 0449) Weight:  [180 lb (81.647 kg)] 180 lb (81.647 kg) (09/23 1545)  Intake/Output from previous day: 09/23 0701 - 09/24 0700 In: 6961.9 [P.O.:560; I.V.:6401.9] Out: 2100 [Urine:1350] Intake/Output this shift:    Recent Labs Lab 03/22/14 0525  WBC 8.9  HGB 8.7*  HCT 27.2*  PLT 172    No results found for this basename: NA, K, CL, CO2, BUN, CREATININE, CALCIUM, LABALBU, PROT, BILITOT, ALKPHOS, ALT, AST, GLUCOSE,  in the last 168 hours  EXAM: General: cooperative, fatigued and no distress Resp: clear to auscultation bilaterally Cardio: regular rate and rhythm, S1, S2 normal, no murmur, click, rub or gallop GI: Decreased bowel sounds, dressing clean/dry/intact Extremities: SCD hose intact and functioning, no calf tenderness. Vaginal Bleeding: scant blood on pad   Assessment: s/p Procedure(s): TAH BSO : stable, progressing well, tolerating diet . Anemia probably consistent with blood loss  Plan: Encourage ambulation Advance to PO medication Routine Care Consider d/c home on 03/23/14  LOS: 1 day    POWELL,ELMIRA, PA-C 03/22/2014 7:48 AM

## 2014-03-23 LAB — CBC
HCT: 25.7 % — ABNORMAL LOW (ref 36.0–46.0)
HEMOGLOBIN: 8.2 g/dL — AB (ref 12.0–15.0)
MCH: 21 pg — AB (ref 26.0–34.0)
MCHC: 31.9 g/dL (ref 30.0–36.0)
MCV: 65.9 fL — AB (ref 78.0–100.0)
Platelets: 151 10*3/uL (ref 150–400)
RBC: 3.9 MIL/uL (ref 3.87–5.11)
RDW: 16.4 % — ABNORMAL HIGH (ref 11.5–15.5)
WBC: 5.4 10*3/uL (ref 4.0–10.5)

## 2014-03-23 MED ORDER — OXYCODONE-ACETAMINOPHEN 5-325 MG PO TABS: 1.0000 | ORAL_TABLET | ORAL | Status: DC | PRN

## 2014-03-23 MED ORDER — IBUPROFEN 600 MG PO TABS: ORAL_TABLET | ORAL | Status: DC

## 2014-03-23 MED ORDER — ONDANSETRON HCL 4 MG PO TABS: 4.0000 mg | ORAL_TABLET | Freq: Three times a day (TID) | ORAL | Status: DC | PRN

## 2014-03-23 NOTE — Discharge Summary (Signed)
Physician Discharge Summary  Patient ID: Melanie Mills MRN: [id number] DOB/AGE: [birth year] 46 y.o.  Admit date: 03/21/2014 Discharge date: 03/23/2014   Discharge Diagnoses: Symptomatic Uterine Fibroids, Menorrhagia, Pelvic Pain, Right Ovarian Mass (on ultrasound)  and Elevated CA-125  Active Problems:   Fibroids, intramural   Menorrhagia with regular cycle   Anemia, iron deficiency   Right ovarian cyst   Fibroids   Fallopian tube disorder   Acute blood loss anemia   Endometriosis of fallopian tube   Operation: Total Abdominal Hysterectomy, Bilateral Salpingo-oophorectomy, Excision of Right Tubal Mass, Lysis of Adhesions and Peritoneal Washings   Discharged Condition: Good  Hospital Course: On the date of admission the patient underwent the aforementioned procedures and tolerated them well.  Post operative course was unremarkable with the patient tolerating a post operative hemoglobin of 8.2 (pre-operative hemoglobin = 12.6).  By post operative day #2 the patient had resumed bowel and bladder function and was therefore deemed ready for discharge home.  Disposition: Final discharge disposition not confirmed  Discharge Medications:    Medication List         ferrous sulfate 325 (65 FE) MG tablet  Take 650 mg by mouth daily with breakfast.     ibuprofen 600 MG tablet  Commonly known as:  ADVIL,MOTRIN  1  po  pc every 6 hours for 5 days then prn-pain     levothyroxine 175 MCG tablet  Commonly known as:  SYNTHROID, LEVOTHROID  Take 175 mcg by mouth daily before breakfast.     ondansetron 4 MG tablet  Commonly known as:  ZOFRAN  Take 1 tablet (4 mg total) by mouth every 8 (eight) hours as needed for nausea or vomiting.     oxyCODONE-acetaminophen 5-325 MG per tablet  Commonly known as:  PERCOCET/ROXICET  Take 1-2 tablets by mouth every 4 (four) hours as needed for severe pain (moderate to severe pain (when tolerating fluids)).          Follow-up: Dr. Lorriane Shire P.  Haygood on May 02, 2014 at 2 p.m.   SignedEarnstine Regal, PA-C 03/23/2014, 7:38 AM

## 2014-03-23 NOTE — Progress Notes (Signed)
Pt  Out in wheelchair   Teaching complete  

## 2014-03-23 NOTE — Progress Notes (Signed)
Melanie Mills is a11 y.o.  [id number]  Post Op Date #2:  TAH/BSO/Excision of Right Tubal Mass  Subjective: Patient is Doing well postoperatively. Patient has good pain control with oral Percocet and Ibuprofen., ambulating without difficulty tolerating regular diet, passing flatus and voiding without difficulty.  Objective: Vital signs in last 24 hours: Temp:  [98.2 F (36.8 C)-99 F (37.2 C)] 98.6 F (37 C) (09/25 0616) Pulse Rate:  [55-87] 73 (09/25 0616) Resp:  [12-18] 16 (09/25 0616) BP: (83-104)/(47-64) 104/64 mmHg (09/25 0616) SpO2:  [95 %-97 %] 95 % (09/25 0616)  Intake/Output from previous day: 09/24 0701 - 09/25 0700 In: 1288 [P.O.:718; I.V.:570] Out: 1500 [Urine:1500] Intake/Output this shift:    Recent Labs Lab 03/22/14 0525 03/23/14 0525  WBC 8.9 5.4  HGB 8.7* 8.2*  HCT 27.2* 25.7*  PLT 172 151    No results found for this basename: NA, K, CL, CO2, BUN, CREATININE, CALCIUM, LABALBU, PROT, BILITOT, ALKPHOS, ALT, AST, GLUCOSE,  in the last 168 hours  EXAM: General: alert, cooperative and no distress Resp: clear to auscultation bilaterally Cardio: regular rate and rhythm, S1, S2 normal, no murmur, click, rub or gallop GI: Bowel sounds present, Honeycomb dressing is clean, dry and intact; no evidence of infection. Extremities: Homan's negative and no calf tenderness.   Assessment: s/p Procedure(s): Abdominal Hysterectomy, Bilateral Salpingectomy,Bilateralt Oopherectomies, excision of Right Tubal Mass. : stable, progressing well and anemia that is attributed to surgical loss and dilution  Plan: Discharge home  LOS: 2 days    POWELL,ELMIRA, PA-C 03/23/2014 7:25 AM

## 2014-03-23 NOTE — Discharge Instructions (Signed)
Call [facility] @ [phone] if:  You have a temperature greater than or equal to 100.4 degrees Farenheit orally You have pain that is not made better by the pain medication given and taken as directed You have excessive bleeding or problems urinating  Take Colace (Docusate Sodium/Stool Softener) 100 mg 2-3 times daily while taking narcotic pain medicine to avoid constipation or until bowel movements are regular. Take iron supplement twice  daily for 12 weeks  You may drive after 2 weeks You may walk up steps  You may shower  You may resume a regular diet  Keep incisions clean and dry Do not lift over 15 pounds for 6 weeks Avoid anything in vagina for 6 weeks (or until after your post-operative visit)  Keep your post operative appointment with Dr. Leo Grosser on April 30, 2014 at 2 p.m.

## 2018-09-28 DIAGNOSIS — E119 Type 2 diabetes mellitus without complications: Secondary | ICD-10-CM

## 2018-09-28 HISTORY — DX: Type 2 diabetes mellitus without complications: E11.9

## 2019-08-02 ENCOUNTER — Ambulatory Visit: Payer: Self-pay | Admitting: Family Medicine

## 2019-08-18 ENCOUNTER — Other Ambulatory Visit: Payer: Self-pay

## 2019-08-18 ENCOUNTER — Encounter: Payer: Self-pay | Admitting: Family Medicine

## 2019-08-18 ENCOUNTER — Telehealth: Payer: Self-pay

## 2019-08-18 ENCOUNTER — Ambulatory Visit: Payer: 59 | Admitting: Family Medicine

## 2019-08-18 VITALS — BP 104/73 | HR 85 | Temp 98.1°F | Resp 16 | Ht 64.0 in | Wt 164.2 lb

## 2019-08-18 DIAGNOSIS — E119 Type 2 diabetes mellitus without complications: Secondary | ICD-10-CM | POA: Diagnosis not present

## 2019-08-18 DIAGNOSIS — E663 Overweight: Secondary | ICD-10-CM

## 2019-08-18 DIAGNOSIS — Z1231 Encounter for screening mammogram for malignant neoplasm of breast: Secondary | ICD-10-CM

## 2019-08-18 DIAGNOSIS — D569 Thalassemia, unspecified: Secondary | ICD-10-CM | POA: Diagnosis not present

## 2019-08-18 DIAGNOSIS — N809 Endometriosis, unspecified: Secondary | ICD-10-CM | POA: Insufficient documentation

## 2019-08-18 DIAGNOSIS — N808 Other endometriosis: Secondary | ICD-10-CM | POA: Insufficient documentation

## 2019-08-18 DIAGNOSIS — N6489 Other specified disorders of breast: Secondary | ICD-10-CM | POA: Insufficient documentation

## 2019-08-18 DIAGNOSIS — Z0000 Encounter for general adult medical examination without abnormal findings: Secondary | ICD-10-CM

## 2019-08-18 DIAGNOSIS — Z1211 Encounter for screening for malignant neoplasm of colon: Secondary | ICD-10-CM

## 2019-08-18 DIAGNOSIS — Z7689 Persons encountering health services in other specified circumstances: Secondary | ICD-10-CM

## 2019-08-18 DIAGNOSIS — N952 Postmenopausal atrophic vaginitis: Secondary | ICD-10-CM

## 2019-08-18 DIAGNOSIS — E039 Hypothyroidism, unspecified: Secondary | ICD-10-CM

## 2019-08-18 HISTORY — DX: Endometriosis, unspecified: N80.9

## 2019-08-18 HISTORY — DX: Postmenopausal atrophic vaginitis: N95.2

## 2019-08-18 HISTORY — DX: Other specified disorders of breast: N64.89

## 2019-08-18 NOTE — Telephone Encounter (Signed)
Pt was called and scheduled for labs on Monday afternoon.

## 2019-08-18 NOTE — Telephone Encounter (Signed)
Please call pt and try to schedule her for Monday. She does not have to fast.

## 2019-08-18 NOTE — Progress Notes (Signed)
Patient ID: Melanie Mills, female  DOB: [birth year], 54 y.o.   MRN: [id number] Patient Care Team    Relationship Specialty Notifications Start End  Ma Hillock, DO PCP - General Family Medicine  08/18/19     Chief Complaint  Patient presents with  . Establish Care    Would like CPE today. Not Fasting. Mammogram and pap smear was 32yr ago    Subjective:  Melanie Mills a 54y.o.  female present for new patient establishment- cpe. All past medical history, surgical history, allergies, family history, immunizations, medications and social history were updated in the electronic medical record today. All recent labs, ED visits and hospitalizations within the last year were reviewed.  Diabetes type 2: Diagnosis approximately 09/2018.  Pt reports compliance with metformin 500 mg twice daily and Lantus 26 units nightly. Denies numbness, tingling of extremities, hypo/hyperglycemic events or non-healing wounds.  PNA series: Declined today Flu shot: Up-to-date 2020 (recommneded yearly) BMP: Ordered. Foot exam:  Eye exam: Patient encouraged to have yearly eye exam.  We will be happy to place a referral for her if needed. A1c: Patient reports last A1c was 12 approximately 10 months ago.  Hypothyroid: Patient reports she has had a hypothyroid condition for about 20 years.  Her dose has been rather stable over the last few years.  Currently prescribed levothyroxine 175 mcg daily.  Health maintenance:  Colonoscopy: Overdue.  [facility]  If she changes her mind and prefers Cologuard will change orders for her. Mammogram: completed: Overdue.  [facility] Cervical cancer screening: Total hysterectomy- N/A Immunizations: tdap UTD 2015, Influenza up-to-date 2020 (encouraged yearly), shingles vaccination declined today.  Pneumonia vaccine declined (diabetes) Infectious disease screening: HIV screening declined. DEXA: Routine  screening consider starting at 60-65 Assistive device: none Oxygen use: none Patient has a Dental home. Hospitalizations/ED visits:  [facility]  Decreased Interest 1  Down, Depressed, Hopeless 1  PHQ - 2 Score 2  Altered sleeping 0  Tired, decreased energy 2  Change in appetite 0  Feeling bad or failure about yourself  0  Trouble concentrating 0  Moving slowly or fidgety/restless 0  Suicidal thoughts 0  PHQ-9 Score 4  Difficult doing work/chores Not difficult at all   No flowsheet data found.     No flowsheet data found.  Immunization History  Administered Date(s) Administered  . Influenza-Unspecified 03/30/2019  . Tdap 06/29/2013   No exam data present  Past Medical History:  Diagnosis Date  . Anemia   . 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 Chicken pox   . [place]  . Endometriosis of fallopian tube 03/22/2014  . Fibroids, intramural 03/21/2014  . History of BCG vaccination   . History of frequent urinary tract infections   . Hypothyroidism 2000  . 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 Occlusion of breast duct 08/18/2019  . Right ovarian cyst 03/21/2014   Benign.  . Thalassemia    Allergies  Allergen Reactions  . Orange Juice [Orange Oil]     Throat itches     Past Surgical History:  Procedure Laterality Date  . ABDOMINAL HYSTERECTOMY N/A 03/21/2014   Procedure: Abdominal Hysterectomy, Bilateral Salpingectomy,Bilateralt Oopherectomies, excision of Right Tubal Mass  possible Left Oopherectomy ;  Surgeon: VEldred Manges MD;  [facility];  Service: Gynecology;  Laterality: N/A;  . BILATERAL SALPINGOOPHORECTOMY  03/21/2014   Benign  . WISDOM TOOTH EXTRACTION  Family History  Problem Relation Age of Onset  . Diabetes Mother   . Heart disease Mother   . Lung cancer Father   . Diabetes Son   . Autism Son   . Heart disease Maternal Grandmother   . Heart disease Paternal Grandmother   . Diabetes Son    Social  History   Social History Narrative   Marital status/children/pets: Married, 3 children (set of twins).  Husband is an internal medicine doctor and a solo practice in [place].       All past medical history, surgical history, allergies, family history, immunizations andmedications were updated in the EMR today and reviewed under the history and medication portions of their EMR.    No results found for this or any previous visit (from the past 2160 hour(s)).  No results found.   ROS: 14 pt review of systems performed and negative (unless mentioned in an HPI)  Objective: BP 104/73 (BP Location: Left Arm, Patient Position: Sitting, Cuff Size: Normal)   Pulse 85   Temp 98.1 F (36.7 C) (Temporal)   Resp 16   Ht 5' 4"  (1.626 m)   Wt 164 lb 4 oz (74.5 kg)   LMP 01/23/2014   SpO2 98%   BMI 28.19 kg/m  Gen: Afebrile. No acute distress. Nontoxic in appearance, well-developed, well-nourished, very pleasant, overweight female.   HENT: AT. [place]. Bilateral TM visualized and normal in appearance, normal external auditory canal. MMM, no oral lesions, adequate dentition. Bilateral nares within normal limits. Throat without erythema, ulcerations or exudates. no Cough on exam, no hoarseness on exam. Eyes:Pupils Equal Round Reactive to light, Extraocular movements intact,  Conjunctiva without redness, discharge or icterus. Neck/lymp/endocrine: Supple,no lymphadenopathy, no thyromegaly CV: RRR no murmur, no edema, +2/4 P posterior tibialis pulses.  Chest: CTAB, no wheeze, rhonchi or crackles. normal Respiratory effort. good Air movement. Abd: Soft. flat. NTND. BS present. no Masses palpated. No hepatosplenomegaly. No rebound tenderness or guarding. Skin: no rashes, purpura or petechiae. Warm and well-perfused. Skin intact. Neuro/Msk:  Normal gait. PERLA. EOMi. Alert. Oriented x3.  Cranial nerves II through XII intact. Muscle strength 5/5 upper/lower extremity. DTRs equal bilaterally. Psych: Normal  affect, dress and demeanor. Normal speech. Normal thought content and judgment.   Assessment/plan: Melanie Mills is a 54 y.o. female present for est/cpe/[facility] Diabetes mellitus without complication (HCC)/overweight A1c ordered. Currently prescribed Metformin 500 mg twice daily and Lantus 26 units daily.  Will refill medications at appropriate dose once labs received. - CBC - Comp Met (CMET) - Lipid panel - Hemoglobin A1c - Urine Microalbumin w/creat. Ratio Routine follow-up every 3 to 4 months  Thalassemia, unspecified type CBC collected today  Hypothyroidism, unspecified type Patient currently prescribed levothyroxine 175 mcg daily.  Refills will be provided for her medication once results received at appropriate dose. - CBC - Comp Met (CMET) - TSH - T4, free - Lipid panel  Breast cancer screening by mammogram - MM 3D SCREEN BREAST BILATERAL; Future Colon cancer screening - Ambulatory referral to Gastroenterology  Encounter for preventive health examination Patient was encouraged to exercise greater than 150 minutes a week. Patient was encouraged to choose a diet filled with fresh fruits and vegetables, and lean meats. AVS provided to patient today for education/recommendation on gender specific health and safety maintenance. Colonoscopy: Overdue.  Will refer to [facility]enterology Dr. Lyndel Safe.  If she changes her mind and prefers Cologuard will change orders for her. Mammogram:  Overdue.  [facility]dered  [facility]. Immunizations: tdap  UTD 2015, Influenza up-to-date 2020 (encouraged yearly), shingles vaccination declined today.  Pneumonia vaccine declined (diabetes) Infectious disease screening: HIV screening declined. DEXA: Routine screening consider starting at 60-65   Return in about 4 months (around 12/16/2019) for [facility]MC (30 min).  Orders Placed This Encounter  Procedures  . MM 3D SCREEN BREAST BILATERAL  . CBC  . Comp Met (CMET)  . TSH  . T4, free  .  Lipid panel  . Hemoglobin A1c  . Urine Microalbumin w/creat. ratio  . Ambulatory referral to Gastroenterology   No orders of the defined types were placed in this encounter. Metformin, levothyroxine and Lantus will be prescribed once labs received in order to taper medication appropriately  Referral Orders     Ambulatory referral to Gastroenterology   Note is dictated utilizing voice recognition software. Although note has been proof read prior to signing, occasional typographical errors still can be missed. If any questions arise, please do not hesitate to call for verification.  Electronically signed by: Howard Pouch, DO [place]

## 2019-08-18 NOTE — Telephone Encounter (Signed)
Patient checked with her insurance. She is okay to have labs drawn. She would like to have them drawn at our office. Patient aware she will need to wait a week to schedule due to staffing.

## 2019-08-18 NOTE — Patient Instructions (Addendum)
She needs: CBC. BMP. TSH. a1c. Lipid ( can either code under overweight/hypothyroid and diabetes for all) Need: either colonoscopy or cologuard  Health Maintenance, Female Adopting a healthy lifestyle and getting preventive care are important in promoting health and wellness. Ask your health care provider about:  The right schedule for you to have regular tests and exams.  Things you can do on your own to prevent diseases and keep yourself healthy. What should I know about diet, weight, and exercise? Eat a healthy diet   Eat a diet that includes plenty of vegetables, fruits, low-fat dairy products, and lean protein.  Do not eat a lot of foods that are high in solid fats, added sugars, or sodium. Maintain a healthy weight Body mass index (BMI) is used to identify weight problems. It estimates body fat based on height and weight. Your health care provider can help determine your BMI and help you achieve or maintain a healthy weight. Get regular exercise Get regular exercise. This is one of the most important things you can do for your health. Most adults should:  Exercise for at least 150 minutes each week. The exercise should increase your heart rate and make you sweat (moderate-intensity exercise).  Do strengthening exercises at least twice a week. This is in addition to the moderate-intensity exercise.  Spend less time sitting. Even light physical activity can be beneficial. Watch cholesterol and blood lipids Have your blood tested for lipids and cholesterol at 52 years of age, then have this test every 5 years. Have your cholesterol levels checked more often if:  Your lipid or cholesterol levels are high.  You are older than 52 years of age.  You are at high risk for heart disease. What should I know about cancer screening? Depending on your health history and family history, you may need to have cancer screening at various ages. This may include screening for:  Breast  cancer.  Cervical cancer.  Colorectal cancer.  Skin cancer.  Lung cancer. What should I know about heart disease, diabetes, and high blood pressure? Blood pressure and heart disease  High blood pressure causes heart disease and increases the risk of stroke. This is more likely to develop in people who have high blood pressure readings, are of African descent, or are overweight.  Have your blood pressure checked: ? Every 3-5 years if you are 44-79 years of age. ? Every year if you are 29 years old or older. Diabetes Have regular diabetes screenings. This checks your fasting blood sugar level. Have the screening done:  Once every three years after age 35 if you are at a normal weight and have a low risk for diabetes.  More often and at a younger age if you are overweight or have a high risk for diabetes. What should I know about preventing infection? Hepatitis B If you have a higher risk for hepatitis B, you should be screened for this virus. Talk with your health care provider to find out if you are at risk for hepatitis B infection. Hepatitis C Testing is recommended for:  Everyone born from [birth year] through 1963.  Anyone with known risk factors for hepatitis C. Sexually transmitted infections (STIs)  Get screened for STIs, including gonorrhea and chlamydia, if: ? You are sexually active and are younger than 52 years of age. ? You are older than 52 years of age and your health care provider tells you that you are at risk for this type of infection. ? Your sexual activity  has changed since you were last screened, and you are at increased risk for chlamydia or gonorrhea. Ask your health care provider if you are at risk.  Ask your health care provider about whether you are at high risk for HIV. Your health care provider may recommend a prescription medicine to help prevent HIV infection. If you choose to take medicine to prevent HIV, you should first get tested for HIV. You should  then be tested every 3 months for as long as you are taking the medicine. Pregnancy  If you are about to stop having your period (premenopausal) and you may become pregnant, seek counseling before you get pregnant.  Take 400 to 800 micrograms (mcg) of folic acid every day if you become pregnant.  Ask for birth control (contraception) if you want to prevent pregnancy. Osteoporosis and menopause Osteoporosis is a disease in which the bones lose minerals and strength with aging. This can result in bone fractures. If you are 19 years old or older, or if you are at risk for osteoporosis and fractures, ask your health care provider if you should:  Be screened for bone loss.  Take a calcium or vitamin D supplement to lower your risk of fractures.  Be given hormone replacement therapy (HRT) to treat symptoms of menopause. Follow these instructions at home: Lifestyle  Do not use any products that contain nicotine or tobacco, such as cigarettes, e-cigarettes, and chewing tobacco. If you need help quitting, ask your health care provider.  Do not use street drugs.  Do not share needles.  Ask your health care provider for help if you need support or information about quitting drugs. Alcohol use  Do not drink alcohol if: ? Your health care provider tells you not to drink. ? You are pregnant, may be pregnant, or are planning to become pregnant.  If you drink alcohol: ? Limit how much you use to 0-1 drink a day. ? Limit intake if you are breastfeeding.  Be aware of how much alcohol is in your drink. In the [place] one drink equals one 12 oz bottle of beer (355 mL), one 5 oz glass of wine (148 mL), or one 1 oz glass of hard liquor (44 mL). General instructions  Schedule regular health, dental, and eye exams.  Stay current with your vaccines.  Tell your health care provider if: ? You often feel depressed. ? You have ever been abused or do not feel safe at home. Summary  Adopting a  healthy lifestyle and getting preventive care are important in promoting health and wellness.  Follow your health care provider's instructions about healthy diet, exercising, and getting tested or screened for diseases.  Follow your health care provider's instructions on monitoring your cholesterol and blood pressure. This information is not intended to replace advice given to you by your health care provider. Make sure you discuss any questions you have with your health care provider. Document Revised: 06/08/2018 Document Reviewed: 06/08/2018 Elsevier Patient Education  [place].   Please help [place] help you:  We are honored you have chosen [facility] for your Primary Care home. Below you will find basic instructions that you may need to access in the future. Please help [place] help you by reading the instructions, which cover many of the frequent questions we experience.   Prescription refills and request:  -In order to allow more efficient response time, please call your pharmacy for all refills. They will forward the request electronically to [place]. This  allows for the quickest possible response. Request left on a nurse line can take longer to refill, since these are checked as time allows between office patients and other phone calls.  - refill request can take up to 3-5 working days to complete.  - If request is sent electronically and request is appropiate, it is usually completed in 1-2 business days.  - all patients will need to be seen routinely for all chronic medical conditions requiring prescription medications (see follow-up below). If you are overdue for follow up on your condition, you will be asked to make an appointment and we will call in enough medication to cover you until your appointment (up to 30 days).  - all controlled substances will require a face to face visit to request/refill.  - if you desire your prescriptions to go through a new pharmacy, and have an active  script at original pharmacy, you will need to call your pharmacy and have scripts transferred to new pharmacy. This is completed between the pharmacy locations and not by your provider.    Results: Our office handles many outgoing and incoming calls daily. If we have not contacted you within 1 week about your results, please check your mychart to see if there is a message first and if not, then contact our office.  In helping with this matter, you help decrease call volume, and therefore allow [place] to be able to respond to patients needs more efficiently.  We will always attempt to call you with results,  normal or abnormal. However, if we are unable to reach you we will send a message in your my chart with results.   Acute office visits (sick visit):  An acute visit is intended for a new problem and are scheduled in shorter time slots to allow schedule openings for patients with new problems. This is the appropriate visit to discuss a new problem. Problems will not be addressed by phone call or Echart message. Appointment is needed if requesting treatment. In order to provide you with excellent quality medical care with proper time for you to explain your problem, have an exam and receive treatment with instructions, these appointments should be limited to one new problem per visit. If you experience a new problem, in which you desire to be addressed, please make an acute office visit, we save openings on the schedule to accommodate you. Please do not save your new problem for any other type of visit, let us take care of it properly and quickly for you.   Follow up visits:  Depending on your condition(s) your provider will need to see you routinely in order to provide you with quality care and prescribe medication(s). Most chronic conditions (Example: hypertension, Diabetes, depression/anxiety... etc), require visits a couple times a year. Your provider will instruct you on proper follow up for your  personal medical conditions and history. Please make certain to make follow up appointments for your condition as instructed. Failing to do so could result in lapse in your medication treatment/refills. If you request a refill, and are overdue to be seen on a condition, we will always provide you with a 30 day script (once) to allow you time to schedule.    Medicare wellness (well visit): - we have a wonderful Nurse Maudie Mercury), that will meet with you and provide you will yearly medicare wellness visits. These visits should occur yearly (can not be scheduled less than 1 calendar year apart) and cover preventive health, immunizations, advance directives and  screenings you are entitled to yearly through your medicare benefits. Do not miss out on your entitled benefits, this is when medicare will pay for these benefits to be ordered for you.  These are strongly encouraged by your provider and is the appropriate type of visit to make certain you are up to date with all preventive health benefits. If you have not had your medicare wellness exam in the last 12 months, please make certain to schedule one by calling the office and schedule your medicare wellness with Maudie Mercury as soon as possible.   Yearly physical (well visit):  - Adults are recommended to be seen yearly for physicals. Check with your insurance and date of your last physical, most insurances require one calendar year between physicals. Physicals include all preventive health topics, screenings, medical exam and labs that are appropriate for gender/age and history. You may have fasting labs needed at this visit. This is a well visit (not a sick visit), new problems should not be covered during this visit (see acute visit).  - Pediatric patients are seen more frequently when they are younger. Your provider will advise you on well child visit timing that is appropriate for your their age. - This is not a medicare wellness visit. Medicare wellness exams do not  have an exam portion to the visit. Some medicare companies allow for a physical, some do not allow a yearly physical. If your medicare allows a yearly physical you can schedule the medicare wellness with our nurse Maudie Mercury and have your physical with your provider after, on the same day. Please check with insurance for your full benefits.   Late Policy/No Shows:  - all new patients should arrive 15-30 minutes earlier than appointment to allow [place] time  to  obtain all personal demographics,  insurance information and for you to complete office paperwork. - All established patients should arrive 10-15 minutes earlier than appointment time to update all information and be checked in .  - In our best efforts to run on time, if you are late for your appointment you will be asked to either reschedule or if able, we will work you back into the schedule. There will be a wait time to work you back in the schedule,  depending on availability.  - If you are unable to make it to your appointment as scheduled, please call 24 hours ahead of time to allow [place] to fill the time slot with someone else who needs to be seen. If you do not cancel your appointment ahead of time, you may be charged a no show fee.

## 2019-08-21 ENCOUNTER — Ambulatory Visit: Payer: 59

## 2019-08-21 ENCOUNTER — Encounter: Payer: Self-pay | Admitting: Gastroenterology

## 2019-08-21 ENCOUNTER — Other Ambulatory Visit: Payer: Self-pay

## 2019-08-21 NOTE — Addendum Note (Signed)
Addended by: Ralph Dowdy on: 08/21/2019 02:59 PM   Modules accepted: Orders

## 2019-08-22 LAB — CBC
HCT: 39.6 % (ref 36.0–46.0)
Hemoglobin: 12.4 g/dL (ref 12.0–15.0)
MCHC: 31.3 g/dL (ref 30.0–36.0)
MCV: 63.7 fl — ABNORMAL LOW (ref 78.0–100.0)
Platelets: 259 10*3/uL (ref 150.0–400.0)
RBC: 6.21 Mil/uL — ABNORMAL HIGH (ref 3.87–5.11)
RDW: 15.8 % — ABNORMAL HIGH (ref 11.5–15.5)
WBC: 8.6 10*3/uL (ref 4.0–10.5)

## 2019-08-22 LAB — MICROALBUMIN / CREATININE URINE RATIO
Creatinine, Urine: 14 mg/dL — ABNORMAL LOW (ref 20–275)
Microalb, Ur: <0.2 mg/dL

## 2019-08-22 LAB — LIPID PANEL
Cholesterol: 210 mg/dL — ABNORMAL HIGH (ref 0–200)
HDL: 45.5 mg/dL (ref >39.00)
LDL Cholesterol: 141 mg/dL — ABNORMAL HIGH (ref 0–99)
NonHDL: 164.28
Total CHOL/HDL Ratio: 5
Triglycerides: 118 mg/dL (ref 0.0–149.0)
VLDL: 23.6 mg/dL (ref 0.0–40.0)

## 2019-08-22 LAB — COMPREHENSIVE METABOLIC PANEL
ALT: 20 U/L (ref 0–35)
AST: 21 U/L (ref 0–37)
Albumin: 4.3 g/dL (ref 3.5–5.2)
Alkaline Phosphatase: 68 U/L (ref 39–117)
BUN: 15 mg/dL (ref 6–23)
CO2: 27 mEq/L (ref 19–32)
Calcium: 9.9 mg/dL (ref 8.4–10.5)
Chloride: 104 mEq/L (ref 96–112)
Creatinine, Ser: 0.74 mg/dL (ref 0.40–1.20)
GFR: 82.46 mL/min (ref >60.00)
Glucose, Bld: 94 mg/dL (ref 70–99)
Potassium: 4 mEq/L (ref 3.5–5.1)
Sodium: 140 mEq/L (ref 135–145)
Total Bilirubin: 0.5 mg/dL (ref 0.2–1.2)
Total Protein: 7.2 g/dL (ref 6.0–8.3)

## 2019-08-22 LAB — T4, FREE: Free T4: 1.44 ng/dL (ref 0.60–1.60)

## 2019-08-22 LAB — TSH: TSH: <0.01 u[IU]/mL — ABNORMAL LOW (ref 0.35–4.50)

## 2019-08-23 ENCOUNTER — Telehealth: Payer: Self-pay | Admitting: Family Medicine

## 2019-08-23 LAB — HEMOGLOBIN A1C: Hgb A1c MFr Bld: 11.4 % — ABNORMAL HIGH (ref 4.6–6.5)

## 2019-08-23 MED ORDER — BLOOD GLUCOSE METER KIT: PACK | 11 refills | Status: AC

## 2019-08-23 MED ORDER — LEVOTHYROXINE SODIUM 150 MCG PO TABS: 150.0000 ug | ORAL_TABLET | Freq: Every day | ORAL | 1 refills | Status: DC

## 2019-08-23 MED ORDER — GLIPIZIDE 5 MG PO TABS: 5.0000 mg | ORAL_TABLET | Freq: Two times a day (BID) | ORAL | 5 refills | Status: DC

## 2019-08-23 MED ORDER — METFORMIN HCL 1000 MG PO TABS: 1000.0000 mg | ORAL_TABLET | Freq: Two times a day (BID) | ORAL | 5 refills | Status: DC

## 2019-08-23 MED ORDER — INSULIN GLARGINE 100 UNIT/ML ~~LOC~~ SOLN: 26.0000 [IU] | Freq: Every day | SUBCUTANEOUS | 1 refills | Status: DC

## 2019-08-23 NOTE — Addendum Note (Signed)
Addended by: Caroll Rancher L on: 08/23/2019 04:21 PM   Modules accepted: Orders

## 2019-08-23 NOTE — Telephone Encounter (Signed)
Please inform patient the following information: -Liver, kidneys and electrolytes are in normal range. -Her blood cell counts are consistent with her known thalassemia condition and stable. -Her cholesterol panel is above goal with an LDL of 141.  Her goal should be less than 100 and ideally 70 or lower.  She would benefit from a statin medication.  However I do not want to start multiple new medications for her all at the same time.  Therefore we will discuss at her follow-up appointment. -Her A1c is 11.4.  Her diabetes condition needs tighter control. Thyroid/ TSH is highly OVER supplemented. I have decreased her reported dose of 175 mcg (please check that  Was reported  Accurately) to levothyroxine 150 mcg.    -I have called in increased dose of Metformin.  She will take 1000 mg twice daily (was 500 mg twice daily).   -I have added glipizide 5 mg 2 times a day with meals.   -For now we will keep her Lantus dose at 26 units.    -  In 1 week after starting the increased regimen listed above> if fasting blood sugars are greater than 110 increase Lantus dose by 2 units (to a total of 28 u).      -After an  additional 1 week if fasting blood glucose is still above 110 increase by another 2 units for a maximum of total of Lantus 30 units daily administration prior to next appointment  Please have her record daily fasting blood glucose levels and additional random blood glucose level daily.  Please tell her to bring this record in with her to her appointment in 3-4 weeks so that we can taper her medications for better control.  -Please schedule her for follow-up in 4 weeks in person.  We will closely review her diabetes logs, recheck her thyroid and discuss her cholesterol panel and consider starting Lipitor.   -Please make sure she has diabetic supplies, meter etc.  She is also using Lantus (not a pen), and will need syringes /needles for injections.  syringes/needles for this as well. Syringes/needles  for this as well.

## 2019-08-23 NOTE — Telephone Encounter (Signed)
Pt was called and spouse was on speaker phone. They verbalized understanding and do need meter and supplies sent to the pharmacy. F/U appt was scheduled.

## 2019-09-22 ENCOUNTER — Ambulatory Visit: Payer: 59 | Admitting: Family Medicine

## 2019-09-22 ENCOUNTER — Other Ambulatory Visit: Payer: Self-pay

## 2019-09-22 ENCOUNTER — Encounter: Payer: Self-pay | Admitting: Family Medicine

## 2019-09-22 VITALS — BP 117/78 | HR 93 | Temp 98.0°F | Resp 17 | Ht 64.0 in | Wt 163.5 lb

## 2019-09-22 DIAGNOSIS — R7989 Other specified abnormal findings of blood chemistry: Secondary | ICD-10-CM | POA: Diagnosis not present

## 2019-09-22 DIAGNOSIS — E663 Overweight: Secondary | ICD-10-CM

## 2019-09-22 DIAGNOSIS — E1169 Type 2 diabetes mellitus with other specified complication: Secondary | ICD-10-CM

## 2019-09-22 DIAGNOSIS — E038 Other specified hypothyroidism: Secondary | ICD-10-CM | POA: Diagnosis not present

## 2019-09-22 DIAGNOSIS — E119 Type 2 diabetes mellitus without complications: Secondary | ICD-10-CM | POA: Diagnosis not present

## 2019-09-22 DIAGNOSIS — E785 Hyperlipidemia, unspecified: Secondary | ICD-10-CM

## 2019-09-22 MED ORDER — ATORVASTATIN CALCIUM 10 MG PO TABS: 10.0000 mg | ORAL_TABLET | Freq: Every day | ORAL | 3 refills | Status: DC

## 2019-09-22 NOTE — Patient Instructions (Signed)
I will call you with lab results.  I would recommend you start Lipitor for cardiovascular protection. I have called this in for you.   Continue diabetes meds at current doses. Keep monitoring sugar.  I have referred you to the eye doctor for diabetic eye exam and podiatrist.   Follow up here end of May> they will call you to get on the schedule at that time.     Diabetes Mellitus and Exercise Exercising regularly is important for your overall health, especially when you have diabetes (diabetes mellitus). Exercising is not only about losing weight. It has many other health benefits, such as increasing muscle strength and bone density and reducing body fat and stress. This leads to improved fitness, flexibility, and endurance, all of which result in better overall health. Exercise has additional benefits for people with diabetes, including:  Reducing appetite.  Helping to lower and control blood glucose.  Lowering blood pressure.  Helping to control amounts of fatty substances (lipids) in the blood, such as cholesterol and triglycerides.  Helping the body to respond better to insulin (improving insulin sensitivity).  Reducing how much insulin the body needs.  Decreasing the risk for heart disease by: ? Lowering cholesterol and triglyceride levels. ? Increasing the levels of good cholesterol. ? Lowering blood glucose levels. What is my activity plan? Your health care provider or certified diabetes educator can help you make a plan for the type and frequency of exercise (activity plan) that works for you. Make sure that you:  Do at least 150 minutes of moderate-intensity or vigorous-intensity exercise each week. This could be brisk walking, biking, or water aerobics. ? Do stretching and strength exercises, such as yoga or weightlifting, at least 2 times a week. ? Spread out your activity over at least 3 days of the week.  Get some form of physical activity every day. ? Do not go  more than 2 days in a row without some kind of physical activity. ? Avoid being inactive for more than 30 minutes at a time. Take frequent breaks to walk or stretch.  Choose a type of exercise or activity that you enjoy, and set realistic goals.  Start slowly, and gradually increase the intensity of your exercise over time. What do I need to know about managing my diabetes?   Check your blood glucose before and after exercising. ? If your blood glucose is 240 mg/dL (13.3 mmol/L) or higher before you exercise, check your urine for ketones. If you have ketones in your urine, do not exercise until your blood glucose returns to normal. ? If your blood glucose is 100 mg/dL (5.6 mmol/L) or lower, eat a snack containing 15-20 grams of carbohydrate. Check your blood glucose 15 minutes after the snack to make sure that your level is above 100 mg/dL (5.6 mmol/L) before you start your exercise.  Know the symptoms of low blood glucose (hypoglycemia) and how to treat it. Your risk for hypoglycemia increases during and after exercise. Common symptoms of hypoglycemia can include: ? Hunger. ? Anxiety. ? Sweating and feeling clammy. ? Confusion. ? Dizziness or feeling light-headed. ? Increased heart rate or palpitations. ? Blurry vision. ? Tingling or numbness around the mouth, lips, or tongue. ? Tremors or shakes. ? Irritability.  Keep a rapid-acting carbohydrate snack available before, during, and after exercise to help prevent or treat hypoglycemia.  Avoid injecting insulin into areas of the body that are going to be exercised. For example, avoid injecting insulin into: ? The arms,  when playing tennis. ? The legs, when jogging.  Keep records of your exercise habits. Doing this can help you and your health care provider adjust your diabetes management plan as needed. Write down: ? Food that you eat before and after you exercise. ? Blood glucose levels before and after you exercise. ? The type and  amount of exercise you have done. ? When your insulin is expected to peak, if you use insulin. Avoid exercising at times when your insulin is peaking.  When you start a new exercise or activity, work with your health care provider to make sure the activity is safe for you, and to adjust your insulin, medicines, or food intake as needed.  Drink plenty of water while you exercise to prevent dehydration or heat stroke. Drink enough fluid to keep your urine clear or pale yellow. Summary  Exercising regularly is important for your overall health, especially when you have diabetes (diabetes mellitus).  Exercising has many health benefits, such as increasing muscle strength and bone density and reducing body fat and stress.  Your health care provider or certified diabetes educator can help you make a plan for the type and frequency of exercise (activity plan) that works for you.  When you start a new exercise or activity, work with your health care provider to make sure the activity is safe for you, and to adjust your insulin, medicines, or food intake as needed. This information is not intended to replace advice given to you by your health care provider. Make sure you discuss any questions you have with your health care provider. Document Revised: 01/07/2017 Document Reviewed: 11/25/2015 Elsevier Patient Education  [place].

## 2019-09-22 NOTE — Progress Notes (Signed)
Patient ID: Melanie Mills, female  DOB: 1966-06-17, 54 y.o.   MRN: [id number] Patient Care Team    Relationship Specialty Notifications Start End  Ma Hillock, DO PCP - General Family Medicine  08/18/19   Jackquline Denmark, MD Consulting Physician Gastroenterology  09/26/19     Chief Complaint  Patient presents with  . Follow-up    F/U for DM. Pt has Blood sugar logs with her today.     Subjective: Melanie Mills is a 54 y.o.  female present for [facility] Diabetes type 2: Diagnosis approximately 09/2018.  Pt reports compliance with metformin 1000 mg twice daily, glipizide 5 mg twice daily and Lantus 26 units nightly. Patient denies dizziness, hyperglycemic or hypoglycemic events. Patient denies numbness, tingling in the extremities or nonhealing wounds of feet.  She reports she is feeling much better than when she first established a few months ago.  She is no longer dizzy and her energy has improved with the addition of glipizide.  She brings in her blood sugars today which are ranging between 110-130. PNA series: Declined  Flu shot: Up-to-date 2020 (recommneded yearly) BMP:utd Foot exam: Thickened nails on exam today, podiatry referred. Eye exam: Due for diabetic eye exam.  Referral placed today. A1c: Patient reported initial A1c of 12, A1c 08/21/2019 11.4.  Hypothyroid: Patient reports she has had a hypothyroid condition for about 20 years.  TSH on establishment appointment was severely over supplemented.  Patient's levothyroxine dose was decreased from levothyroxine 175 mcg to levothyroxine 150 mcg.  She returns today for follow-up for 8 weeks lab recheck.  She reports she is feeling extremely better already and she is uncertain if it is from the lower dose of thyroid versus getting her diabetes under better management.  Depression screen PHQ 2/9 08/18/2019  Decreased Interest 1  Down, Depressed, Hopeless 1  PHQ - 2 Score 2  Altered sleeping 0  Tired, decreased energy 2  Change in  appetite 0  Feeling bad or failure about yourself  0  Trouble concentrating 0  Moving slowly or fidgety/restless 0  Suicidal thoughts 0  PHQ-9 Score 4  Difficult doing work/chores Not difficult at all   No flowsheet data found.     No flowsheet data found.  Immunization History  Administered Date(s) Administered  . Influenza-Unspecified 03/30/2019  . Moderna SARS-COVID-2 Vaccination 07/28/2019, 08/23/2019  . Tdap 06/29/2013   No exam data present  Past Medical History:  Diagnosis Date  . Anemia   . 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 Chicken pox   . Diabetes ([place]) 09/2018  . Endometriosis of fallopian tube 03/22/2014  . Fibroids, intramural 03/21/2014  . History of BCG vaccination   . History of frequent urinary tract infections   . Hypothyroidism 2000  . 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 Occlusion of breast duct 08/18/2019  . Right ovarian cyst 03/21/2014   Benign.  . Thalassemia    Allergies  Allergen Reactions  . Orange Juice [Orange Oil]     Throat itches     Past Surgical History:  Procedure Laterality Date  . ABDOMINAL HYSTERECTOMY N/A 03/21/2014   Procedure: Abdominal Hysterectomy, Bilateral Salpingectomy,Bilateralt Oopherectomies, excision of Right Tubal Mass  possible Left Oopherectomy ;  Surgeon: Eldred Manges, MD;  Location: [facility];  Service: Gynecology;  Laterality: N/A;  . BILATERAL SALPINGOOPHORECTOMY  03/21/2014   Benign  . WISDOM TOOTH EXTRACTION     Family History  Problem Relation Age of Onset  . Diabetes Mother   .  Heart disease Mother   . Lung cancer Father   . Diabetes Son   . Autism Son   . Heart disease Maternal Grandmother   . Heart disease Paternal Grandmother   . Diabetes Son    Social History   Social History Narrative   Marital status/children/pets: Married, 3 children (set of twins).  Husband is an internal medicine doctor and a solo practice in [place].       All past medical history, surgical history, allergies,  family history, immunizations andmedications were updated in the EMR today and reviewed under the history and medication portions of their EMR.     No results found.   ROS: 14 pt review of systems performed and negative (unless mentioned in an HPI)  Objective: BP 117/78 (BP Location: Right Arm, Patient Position: Sitting, Cuff Size: Normal)   Pulse 93   Temp 98 F (36.7 C) (Temporal)   Resp 17   Ht 5\' 4"  (1.626 m)   Wt 163 lb 8 oz (74.2 kg)   LMP 01/23/2014   SpO2 97%   BMI 28.06 kg/m  Gen: Afebrile. No acute distress.  HENT: AT. [place].  Eyes:Pupils Equal Round Reactive to light, Extraocular movements intact,  Conjunctiva without redness, discharge or icterus. Neck/lymp/endocrine: Supple, no lymphadenopathy, no thyromegaly CV: RRR no murmur, no edema, +2/4 P posterior tibialis pulses Chest: CTAB, no wheeze or crackles Abd: Soft. NTND. BS present.  No masses palpated.  Skin: No rashes, purpura or petechiae.  Neuro:  Normal gait. PERLA. EOMi. Alert. Oriented x3 Psych: Normal affect, dress and demeanor. Normal speech. Normal thought content and judgment.  Assessment/plan: Melanie Mills is a 53 y.o. female present for est/cpe/[facility] Diabetes mellitus without complication (HCC)/overweight Blood sugar reports look much improved with the addition of glipizide and the increase in Metformin.  She has not tapered up on her Lantus as of yet since her sugars appear to be better controlled.  She is feeling much improved. Continue  Lantus 26 units daily.   Continue glip 5 mg BID.  Continue metformin 1000 BID.  Routine follow-up and May when A1c will be due  Hypothyroidism, unspecified type Patient reports compliance with the lower dose levothyroxine 150 mcg daily.  Will recheck TSH, T3 and T4 today on lower dose.  Refills will be provided once lab results received to ensure appropriate dosing.  Hyperlipidemia associated with type 2 diabetes: Discussed diet and exercise  recommendations. Reviewed her lipid panel with her today and discussed American Heart Association recommendation of statin and she is agreeable to starting today. Start atorvastatin 10 mg daily. Follow-up with repeat lipids and LFTs at her next appointment.  Return in about 2 months (around 11/21/2019).  Orders Placed This Encounter  Procedures  . TSH  . T4, free  . T3, free  . Ambulatory referral to Ophthalmology  . Ambulatory referral to [place] ordered this encounter  Medications  . atorvastatin (LIPITOR) 10 MG tablet    Sig: Take 1 tablet (10 mg total) by mouth daily.    Dispense:  90 tablet    Refill:  3    Referral Orders     Ambulatory referral to Ophthalmology     Ambulatory referral to Podiatry   Note is dictated utilizing voice recognition software. Although note has been proof read prior to signing, occasional typographical errors still can be missed. If any questions arise, please do not hesitate to call for verification.  Electronically signed by: Howard Pouch, DO [facility]-  Melanie Mills

## 2019-09-23 LAB — T3, FREE: T3, Free: 2.6 pg/mL (ref 2.3–4.2)

## 2019-09-23 LAB — T4, FREE: Free T4: 1.5 ng/dL (ref 0.8–1.8)

## 2019-09-23 LAB — TSH: TSH: 0.02 mIU/L — ABNORMAL LOW

## 2019-09-25 ENCOUNTER — Telehealth: Payer: Self-pay | Admitting: Family Medicine

## 2019-09-25 MED ORDER — LEVOTHYROXINE SODIUM 125 MCG PO TABS: 125.0000 ug | ORAL_TABLET | Freq: Every day | ORAL | 1 refills | Status: DC

## 2019-09-25 NOTE — Telephone Encounter (Signed)
Pt was called and VM was left to return call  °

## 2019-09-25 NOTE — Telephone Encounter (Signed)
Please inform patient her TSH is still severely oversupplemented.  I have called in a new dose of levothyroxine 125 mcg daily.  She is to start this new dose immediately and follow-up on all of her chronic conditions with this provider at the end of May.  We will recheck her thyroid levels at that time along with her A1c.

## 2019-09-25 NOTE — Telephone Encounter (Signed)
Pt returned call and was given lab results, she verbalized understanding. Will call back to schedule appt

## 2019-09-26 ENCOUNTER — Encounter: Payer: Self-pay | Admitting: Family Medicine

## 2019-09-26 DIAGNOSIS — E785 Hyperlipidemia, unspecified: Secondary | ICD-10-CM | POA: Insufficient documentation

## 2019-09-26 DIAGNOSIS — E1169 Type 2 diabetes mellitus with other specified complication: Secondary | ICD-10-CM | POA: Insufficient documentation

## 2019-09-29 ENCOUNTER — Ambulatory Visit
Admission: RE | Admit: 2019-09-29 | Discharge: 2019-09-29 | Disposition: A | Discharge status: Home / Self Care | Payer: 59 | Source: Ambulatory Visit | Attending: Family Medicine | Admitting: Family Medicine

## 2019-09-29 ENCOUNTER — Other Ambulatory Visit: Payer: Self-pay

## 2019-09-29 DIAGNOSIS — Z1231 Encounter for screening mammogram for malignant neoplasm of breast: Secondary | ICD-10-CM

## 2019-10-02 ENCOUNTER — Other Ambulatory Visit: Payer: Self-pay | Admitting: Family Medicine

## 2019-10-02 DIAGNOSIS — R928 Other abnormal and inconclusive findings on diagnostic imaging of breast: Secondary | ICD-10-CM

## 2019-10-06 ENCOUNTER — Telehealth: Payer: Self-pay

## 2019-10-06 NOTE — Telephone Encounter (Signed)
Patient's husband is requesting a call back on 10/09/19 about patient's mammogram results. Patient will be at work & cannot get phone calls.

## 2019-10-06 NOTE — Telephone Encounter (Signed)
Pt had mammogram done on 09/30/2019. I tried to contact husband but there was no answer. He was told on message that Dr Raoul Pitch is out today and would return next week. I told him I would send note to Dr Raoul Pitch and for him to call back.

## 2019-10-06 NOTE — Telephone Encounter (Signed)
Pts husband returned call and said he was just wanting to view the report. They were directed to set up my chart to be able to view labs, x-rays, etc. Text was sent to patients cell phone to do this. He verbalized understanding

## 2019-10-09 NOTE — Telephone Encounter (Signed)
Her mammogram required additional views of her left breast. The imaging center completes call backs and schedules patients for additional imaging. It appears they do have her scheduled for 4/23. Did they have  a question concerning the mammogram or did they just want to be able to view it via mychart?

## 2019-10-09 NOTE — Telephone Encounter (Signed)
Pts husband wanted to be able to view report.

## 2019-10-11 ENCOUNTER — Other Ambulatory Visit: Payer: Self-pay

## 2019-10-11 ENCOUNTER — Ambulatory Visit (AMBULATORY_SURGERY_CENTER): Payer: Self-pay | Admitting: *Deleted

## 2019-10-11 VITALS — Temp 96.8°F | Ht 64.0 in | Wt 163.4 lb

## 2019-10-11 DIAGNOSIS — Z1211 Encounter for screening for malignant neoplasm of colon: Secondary | ICD-10-CM

## 2019-10-11 MED ORDER — SUPREP BOWEL PREP KIT 17.5-3.13-1.6 GM/177ML PO SOLN: ORAL | 0 refills | Status: DC

## 2019-10-11 NOTE — Progress Notes (Signed)
2nd covid vaccine greater than 2 weeks ago  Pt is aware that care partner will wait in the car during procedure; if they feel like they will be too hot or cold to wait in the car; they may wait in the 4 th floor lobby. Patient is aware to bring only one care partner. We want them to wear a mask (we do not have any that we can provide them), practice social distancing, and we will check their temperatures when they get here.  I did remind the patient that their care partner needs to stay in the parking lot the entire time and have a cell phone available, we will call them when the pt is ready for discharge. Patient will wear mask into building.   No trouble with anesthesia, difficulty with intubation or hx/fam hx of malignant hyperthermia per pt   No egg or soy allergy  No home oxygen use   No medications for weight loss taken  emmi information given  Pt denies constipation issues

## 2019-10-20 ENCOUNTER — Ambulatory Visit
Admission: RE | Admit: 2019-10-20 | Discharge: 2019-10-20 | Disposition: A | Discharge status: Home / Self Care | Payer: 59 | Source: Ambulatory Visit | Attending: Family Medicine | Admitting: Family Medicine

## 2019-10-20 ENCOUNTER — Telehealth: Payer: Self-pay | Admitting: Gastroenterology

## 2019-10-20 ENCOUNTER — Other Ambulatory Visit: Payer: Self-pay

## 2019-10-20 DIAGNOSIS — R928 Other abnormal and inconclusive findings on diagnostic imaging of breast: Secondary | ICD-10-CM

## 2019-10-20 NOTE — Telephone Encounter (Signed)
Spoke with patient and answered all her prep questions at this time.

## 2019-10-20 NOTE — Telephone Encounter (Signed)
Patient has questions about procedure. Please call her.  

## 2019-10-25 ENCOUNTER — Ambulatory Visit (AMBULATORY_SURGERY_CENTER): Payer: 59 | Admitting: Gastroenterology

## 2019-10-25 ENCOUNTER — Other Ambulatory Visit: Payer: Self-pay

## 2019-10-25 ENCOUNTER — Encounter: Payer: Self-pay | Admitting: Gastroenterology

## 2019-10-25 VITALS — BP 110/77 | HR 68 | Temp 96.9°F | Resp 13 | Ht 64.0 in | Wt 163.4 lb

## 2019-10-25 DIAGNOSIS — Z1211 Encounter for screening for malignant neoplasm of colon: Secondary | ICD-10-CM

## 2019-10-25 MED ORDER — SODIUM CHLORIDE 0.9 % IV SOLN: 500.0000 mL | Freq: Once | INTRAVENOUS | Status: DC

## 2019-10-25 NOTE — Progress Notes (Signed)
Vs- DT  Temp- JB  Pt's states no medical or surgical changes since previsit or office visit.

## 2019-10-25 NOTE — Progress Notes (Signed)
pt tolerated well. VSS. awake and to recovery. Report given to RN.  

## 2019-10-25 NOTE — Op Note (Signed)
[facility] Patient Name: Melanie Mills Procedure Date: 10/25/2019 9:09 AM MRN: [id number] Endoscopist: Jackquline Denmark , MD Age: 54 Referring MD:  Date of Birth: 13-Jun-1966 Gender: Female Account #: [id number] Procedure:                Colonoscopy Indications:              Screening for colorectal malignant neoplasm Medicines:                Monitored Anesthesia Care Procedure:                Pre-Anesthesia Assessment:                           - Prior to the procedure, a History and Physical                            was performed, and patient medications and                            allergies were reviewed. The patient's tolerance of                            previous anesthesia was also reviewed. The risks                            and benefits of the procedure and the sedation                            options and risks were discussed with the patient.                            All questions were answered, and informed consent                            was obtained. Prior Anticoagulants: The patient has                            taken no previous anticoagulant or antiplatelet                            agents. ASA Grade Assessment: I - A normal, healthy                            patient. After reviewing the risks and benefits,                            the patient was deemed in satisfactory condition to                            undergo the procedure.                           After obtaining informed consent, the colonoscope  was passed under direct vision. Throughout the                            procedure, the patient's blood pressure, pulse, and                            oxygen saturations were monitored continuously. The                            Colonoscope was introduced through the anus and                            advanced to the 2 cm into the ileum. The                            colonoscopy was performed without  difficulty. The                            patient tolerated the procedure well. The quality                            of the bowel preparation was good. The terminal                            ileum, ileocecal valve, appendiceal orifice, and                            rectum were photographed. Scope In: 9:14:39 AM Scope Out: 9:27:35 AM Scope Withdrawal Time: 0 hours 10 minutes 30 seconds  Total Procedure Duration: 0 hours 12 minutes 56 seconds  Findings:                 The colon (entire examined portion) appeared normal                            with well preserved vascular pattern.                           Non-bleeding internal hemorrhoids were found during                            retroflexion. The hemorrhoids were small.                           The terminal ileum appeared normal.                           The exam was otherwise without abnormality on                            direct and retroflexion views. Complications:            No immediate complications. Estimated Blood Loss:     Estimated blood loss: none. Impression:               - Non-bleeding internal hemorrhoids.                           -  Otherwise normal colonoscopy to TI. Recommendation:           - Patient has a contact number available for                            emergencies. The signs and symptoms of potential                            delayed complications were discussed with the                            patient. Return to normal activities tomorrow.                            Written discharge instructions were provided to the                            patient.                           - Resume previous diet.                           - Continue present medications.                           - Repeat colonoscopy in 10 years for screening                            purposes. Earlier, if with any problems or change                            in family history.                           - Return to  GI clinic PRN. Jackquline Denmark, MD 10/25/2019 9:33:23 AM This report has been signed electronically.

## 2019-10-25 NOTE — Patient Instructions (Signed)
YOU HAD AN ENDOSCOPIC PROCEDURE TODAY AT THE [facility]:   Refer to the procedure report that was given to you for any specific questions about what was found during the examination.  If the procedure report does not answer your questions, please call your gastroenterologist to clarify.  If you requested that your care partner not be given the details of your procedure findings, then the procedure report has been included in a sealed envelope for you to review at your convenience later.  YOU SHOULD EXPECT: Some feelings of bloating in the abdomen. Passage of more gas than usual.  Walking can help get rid of the air that was put into your GI tract during the procedure and reduce the bloating. If you had a lower endoscopy (such as a colonoscopy or flexible sigmoidoscopy) you may notice spotting of blood in your stool or on the toilet paper. If you underwent a bowel prep for your procedure, you may not have a normal bowel movement for a few days.  Please Note:  You might notice some irritation and congestion in your nose or some drainage.  This is from the oxygen used during your procedure.  There is no need for concern and it should clear up in a day or so.  SYMPTOMS TO REPORT IMMEDIATELY:  Following lower endoscopy (colonoscopy or flexible sigmoidoscopy):  Excessive amounts of blood in the stool  Significant tenderness or worsening of abdominal pains  Swelling of the abdomen that is new, acute  Fever of 100F or higher   For urgent or emergent issues, a gastroenterologist can be reached at any hour by calling [phone]. Do not use MyChart messaging for urgent concerns.    DIET:  We do recommend a small meal at first, but then you may proceed to your regular diet.  Drink plenty of fluids but you should avoid alcoholic beverages for 24 hours.  ACTIVITY:  You should plan to take it easy for the rest of today and you should NOT DRIVE or use heavy machinery until tomorrow (because  of the sedation medicines used during the test).    FOLLOW UP: Our staff will call the number listed on your records 48-72 hours following your procedure to check on you and address any questions or concerns that you may have regarding the information given to you following your procedure. If we do not reach you, we will leave a message.  We will attempt to reach you two times.  During this call, we will ask if you have developed any symptoms of COVID 19. If you develop any symptoms (ie: fever, flu-like symptoms, shortness of breath, cough etc.) before then, please call [phone].  If you test positive for Covid 19 in the 2 weeks post procedure, please call and report this information to us.    If any biopsies were taken you will be contacted by phone or by letter within the next 1-3 weeks.  Please call us at [phone] if you have not heard about the biopsies in 3 weeks.    SIGNATURES/CONFIDENTIALITY: You and/or your care partner have signed paperwork which will be entered into your electronic medical record.  These signatures attest to the fact that that the information above on your After Visit Summary has been reviewed and is understood.  Full responsibility of the confidentiality of this discharge information lies with you and/or your care-partner.    Resume medications. Information given on hemorrhoids. 

## 2019-10-27 ENCOUNTER — Telehealth: Payer: Self-pay

## 2019-10-27 NOTE — Telephone Encounter (Signed)
Covid-19 screening questions   Do you now or have you had a fever in the last 14 days? No.  Do you have any respiratory symptoms of shortness of breath or cough now or in the last 14 days? No.  Do you have any family members or close contacts with diagnosed or suspected Covid-19 in the past 14 days? No.  Have you been tested for Covid-19 and found to be positive? No.       Follow up Call-  Call back number 10/25/2019  Post procedure Call Back phone  # [phone]  Permission to leave phone message Yes  Some recent data might be hidden     Patient questions:  Do you have a fever, pain , or abdominal swelling? No. Pain Score  0 *  Have you tolerated food without any problems? Yes.    Have you been able to return to your normal activities? Yes.    Do you have any questions about your discharge instructions: Diet   No. Medications  No. Follow up visit  No.  Do you have questions or concerns about your Care? No.  Actions: * If pain score is 4 or above: No action needed, pain <4.

## 2019-11-10 ENCOUNTER — Ambulatory Visit: Payer: Self-pay | Admitting: Podiatry

## 2019-11-17 LAB — HM DIABETES EYE EXAM

## 2019-11-20 ENCOUNTER — Other Ambulatory Visit: Payer: Self-pay | Admitting: Family Medicine

## 2019-11-23 ENCOUNTER — Encounter: Payer: Self-pay | Admitting: Family Medicine

## 2019-11-24 ENCOUNTER — Ambulatory Visit (INDEPENDENT_AMBULATORY_CARE_PROVIDER_SITE_OTHER): Payer: 59

## 2019-11-24 ENCOUNTER — Other Ambulatory Visit: Payer: Self-pay

## 2019-11-24 ENCOUNTER — Ambulatory Visit (INDEPENDENT_AMBULATORY_CARE_PROVIDER_SITE_OTHER): Payer: 59 | Admitting: Podiatry

## 2019-11-24 DIAGNOSIS — M2012 Hallux valgus (acquired), left foot: Secondary | ICD-10-CM | POA: Diagnosis not present

## 2019-11-24 DIAGNOSIS — M2011 Hallux valgus (acquired), right foot: Secondary | ICD-10-CM

## 2019-11-24 DIAGNOSIS — E119 Type 2 diabetes mellitus without complications: Secondary | ICD-10-CM | POA: Diagnosis not present

## 2019-11-24 DIAGNOSIS — M7751 Other enthesopathy of right foot: Secondary | ICD-10-CM | POA: Diagnosis not present

## 2019-11-24 NOTE — Progress Notes (Signed)
  Subjective:  Patient ID: Melanie Mills, female    DOB: 02/07/1966,  MRN: [id number]  Chief Complaint  Patient presents with  . Bunions    Pt states bunion pain, right worse than left. Interested in conservative treatment over surgery.   . Diabetes Mellitus    Diabetic foot exam   54 y.o. female presents with the above complaint. History confirmed with patient.   Objective:  Physical Exam: warm, good capillary refill, no trophic changes or ulcerative lesions, normal DP and PT pulses and normal sensory exam. Hallux valgus bilat R>L  No images are attached to the encounter.  Radiographs: X-ray of both feet: Hallux valgus deformity bilat no acute fractures or dislocations. Assessment:   1. Hallux valgus, right   2. Hallux valgus, left   3. Capsulitis of metatarsophalangeal (MTP) joint of right foot   4. Encounter for diabetic foot exam [facility])      Plan:  Patient was evaluated and treated and all questions answered.  Bunion -XR reviewed with patient -Educated on etiology of deformity -Discussed proper shoe gear modifications and padding -Injection delivered to the painful bursa Procedure: Joint Injection Location: Right 1st MP joint Skin Prep: Alcohol. Injectate: 0.5 cc 1% lidocaine plain, 0.5 cc dexamethasone phosphate. Disposition: Patient tolerated procedure well. Injection site dressed with a band-aid.  DM Risk Level 0 -DM Foot exam performed.  Return if symptoms worsen or fail to improve, for Bunion f/u.

## 2019-12-04 ENCOUNTER — Ambulatory Visit: Payer: 59 | Admitting: Family Medicine

## 2019-12-04 ENCOUNTER — Encounter: Payer: Self-pay | Admitting: Family Medicine

## 2019-12-04 ENCOUNTER — Other Ambulatory Visit: Payer: Self-pay

## 2019-12-04 VITALS — BP 118/82 | HR 86 | Temp 97.8°F | Resp 18 | Ht 64.0 in | Wt 163.5 lb

## 2019-12-04 DIAGNOSIS — Z5181 Encounter for therapeutic drug level monitoring: Secondary | ICD-10-CM

## 2019-12-04 DIAGNOSIS — E1169 Type 2 diabetes mellitus with other specified complication: Secondary | ICD-10-CM | POA: Diagnosis not present

## 2019-12-04 DIAGNOSIS — Z79899 Other long term (current) drug therapy: Secondary | ICD-10-CM

## 2019-12-04 DIAGNOSIS — E785 Hyperlipidemia, unspecified: Secondary | ICD-10-CM

## 2019-12-04 DIAGNOSIS — E039 Hypothyroidism, unspecified: Secondary | ICD-10-CM

## 2019-12-04 DIAGNOSIS — E119 Type 2 diabetes mellitus without complications: Secondary | ICD-10-CM | POA: Diagnosis not present

## 2019-12-04 DIAGNOSIS — E663 Overweight: Secondary | ICD-10-CM | POA: Diagnosis not present

## 2019-12-04 LAB — POCT GLYCOSYLATED HEMOGLOBIN (HGB A1C)
HbA1c POC (<> result, manual entry): 8 % (ref 4.0–5.6)
HbA1c, POC (controlled diabetic range): 8 % — AB (ref 0.0–7.0)
HbA1c, POC (prediabetic range): 8 % — AB (ref 5.7–6.4)
Hemoglobin A1C: 8 % — AB (ref 4.0–5.6)

## 2019-12-04 MED ORDER — GLIPIZIDE 5 MG PO TABS: 5.0000 mg | ORAL_TABLET | Freq: Two times a day (BID) | ORAL | 1 refills | Status: DC

## 2019-12-04 MED ORDER — METFORMIN HCL 1000 MG PO TABS: 1000.0000 mg | ORAL_TABLET | Freq: Two times a day (BID) | ORAL | 5 refills | Status: DC

## 2019-12-04 MED ORDER — INSULIN GLARGINE 100 UNIT/ML ~~LOC~~ SOLN: 26.0000 [IU] | Freq: Every day | SUBCUTANEOUS | 1 refills | Status: DC

## 2019-12-04 MED ORDER — PRAVASTATIN SODIUM 10 MG PO TABS: 10.0000 mg | ORAL_TABLET | Freq: Every day | ORAL | 3 refills | Status: DC

## 2019-12-04 NOTE — Patient Instructions (Addendum)
We will call you lab results and get your thyroid called in .  Your a1c was 8.0 today. (goal 7) You can start checking sugars once a day only - in the morning  Fasting.  Keep meds the same and increase your exercise. If noticing hypoglycemic symptoms try to eat a small protein snack before bed- this will help decrease overnight lows.   switched lipitor for pravastatin> we will check your cholesterol next visit if on medication  Follow up around sept 7.     High Cholesterol  High cholesterol is a condition in which the blood has high levels of a white, waxy, fat-like substance (cholesterol). The human body needs small amounts of cholesterol. The liver makes all the cholesterol that the body needs. Extra (excess) cholesterol comes from the food that we eat. Cholesterol is carried from the liver by the blood through the blood vessels. If you have high cholesterol, deposits (plaques) may build up on the walls of your blood vessels (arteries). Plaques make the arteries narrower and stiffer. Cholesterol plaques increase your risk for heart attack and stroke. Work with your health care provider to keep your cholesterol levels in a healthy range. What increases the risk? This condition is more likely to develop in people who:  Eat foods that are high in animal fat (saturated fat) or cholesterol.  Are overweight.  Are not getting enough exercise.  Have a family history of high cholesterol. What are the signs or symptoms? There are no symptoms of this condition. How is this diagnosed? This condition may be diagnosed from the results of a blood test.  If you are older than age 71, your health care provider may check your cholesterol every 4-6 years.  You may be checked more often if you already have high cholesterol or other risk factors for heart disease. The blood test for cholesterol measures:  "Bad" cholesterol (LDL cholesterol). This is the main type of cholesterol that causes heart  disease. The desired level for LDL is less than 100.  "Good" cholesterol (HDL cholesterol). This type helps to protect against heart disease by cleaning the arteries and carrying the LDL away. The desired level for HDL is 60 or higher.  Triglycerides. These are fats that the body can store or burn for energy. The desired number for triglycerides is lower than 150.  Total cholesterol. This is a measure of the total amount of cholesterol in your blood, including LDL cholesterol, HDL cholesterol, and triglycerides. A healthy number is less than 200. How is this treated? This condition is treated with diet changes, lifestyle changes, and medicines. Diet changes  This may include eating more whole grains, fruits, vegetables, nuts, and fish.  This may also include cutting back on red meat and foods that have a lot of added sugar. Lifestyle changes  Changes may include getting at least 40 minutes of aerobic exercise 3 times a week. Aerobic exercises include walking, biking, and swimming. Aerobic exercise along with a healthy diet can help you maintain a healthy weight.  Changes may also include quitting smoking. Medicines  Medicines are usually given if diet and lifestyle changes have failed to reduce your cholesterol to healthy levels.  Your health care provider may prescribe a statin medicine. Statin medicines have been shown to reduce cholesterol, which can reduce the risk of heart disease. Follow these instructions at home: Eating and drinking If told by your health care provider:  Eat chicken (without skin), fish, veal, shellfish, ground [place] breast, and round  or loin cuts of red meat.  Do not eat fried foods or fatty meats, such as hot dogs and salami.  Eat plenty of fruits, such as apples.  Eat plenty of vegetables, such as broccoli, potatoes, and carrots.  Eat beans, peas, and lentils.  Eat grains such as barley, rice, couscous, and bulgur wheat.  Eat pasta without cream  sauces.  Use skim or nonfat milk, and eat low-fat or nonfat yogurt and cheeses.  Do not eat or drink whole milk, cream, ice cream, egg yolks, or hard cheeses.  Do not eat stick margarine or tub margarines that contain trans fats (also called partially hydrogenated oils).  Do not eat saturated tropical oils, such as coconut oil and palm oil.  Do not eat cakes, cookies, crackers, or other baked goods that contain trans fats.  General instructions  Exercise as directed by your health care provider. Increase your activity level with activities such as gardening, walking, and taking the stairs.  Take over-the-counter and prescription medicines only as told by your health care provider.  Do not use any products that contain nicotine or tobacco, such as cigarettes and e-cigarettes. If you need help quitting, ask your health care provider.  Keep all follow-up visits as told by your health care provider. This is important. Contact a health care provider if:  You are struggling to maintain a healthy diet or weight.  You need help to start on an exercise program.  You need help to stop smoking. Get help right away if:  You have chest pain.  You have trouble breathing. This information is not intended to replace advice given to you by your health care provider. Make sure you discuss any questions you have with your health care provider. Document Revised: 06/18/2017 Document Reviewed: 12/14/2015 Elsevier Patient Education  [place].

## 2019-12-04 NOTE — Progress Notes (Signed)
Patient ID: Melanie Mills, female  DOB: 13-Mar-1966, 54 y.o.   MRN: [id number] Patient Care Team    Relationship Specialty Notifications Start End  Ma Hillock, DO PCP - General Family Medicine  08/18/19   Jackquline Denmark, MD Consulting Physician Gastroenterology  09/26/19     Chief Complaint  Patient presents with  . Hypothyroidism    Pt needs refills on meds  . Diabetes    Subjective: Melanie Mills is a 54 y.o.  female present for [facility] Diabetes type 2/hyperlipidemia: Diagnosis approximately 09/2018.  Pt reports compliance with metformin 1000 mg twice daily, glipizide 5 mg twice daily and Lantus 26 units nightly.  He has had a hypoglycemic one-time over the last 3 months.  She states she worked out in the yard more that day he may be did not eat enough to cover her insulin.  Patient denies dizziness, hyperglycemic or hypoglycemic events. Patient denies numbness, tingling in the extremities or nonhealing wounds of feet.  Fasting blood glucose 73-180, most blood sugars are in the 110-130 range. Patient was started on statin-Lipitor which she states gave her myalgias.  She is willing to try another type. PNA series: Declined  Flu shot: Up-to-date 2020 (recommneded yearly) BMP:utd Foot exam: Thickened nails on exam today, podiatry referred the/20 11/2019 Eye exam: Completed 11/17/2019 A1c: Patient reported initial A1c of 12, A1c 08/21/2019 11.4.> 8.0 today.  Hypothyroid: Patient reports she has had a hypothyroid condition for about 20 years.  TSH on establishment appointment was severely over supplemented.  Patient's levothyroxine dose was decreased from levothyroxine 175 mcg to levothyroxine 150 mcg and was still extremely over replaced.  Is now been reduced to 125 mcg daily.  Patient reports compliance on this medication.  He is due for recheck today.  Depression screen PHQ 2/9 08/18/2019  Decreased Interest 1  Down, Depressed, Hopeless 1  PHQ - 2 Score 2  Altered sleeping 0  Tired,  decreased energy 2  Change in appetite 0  Feeling bad or failure about yourself  0  Trouble concentrating 0  Moving slowly or fidgety/restless 0  Suicidal thoughts 0  PHQ-9 Score 4  Difficult doing work/chores Not difficult at all   No flowsheet data found.     No flowsheet data found.  Immunization History  Administered Date(s) Administered  . Influenza-Unspecified 03/30/2019  . Moderna SARS-COVID-2 Vaccination 07/28/2019, 08/23/2019  . Tdap 06/29/2013   No exam data present  Past Medical History:  Diagnosis Date  . Anemia   . Asthma   . 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 Chicken pox   . Diabetes ([place]) 09/2018  . Endometriosis of fallopian tube 03/22/2014  . Fibroids, intramural 03/21/2014  . History of BCG vaccination   . History of frequent urinary tract infections   . Hyperlipidemia   . Hypothyroidism 2000  . 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 Occlusion of breast duct 08/18/2019  . Right ovarian cyst 03/21/2014   Benign.  . Thalassemia    Allergies  Allergen Reactions  . Orange Juice [Orange Oil]     Throat itches     Past Surgical History:  Procedure Laterality Date  . ABDOMINAL HYSTERECTOMY N/A 03/21/2014   Procedure: Abdominal Hysterectomy, Bilateral Salpingectomy,Bilateralt Oopherectomies, excision of Right Tubal Mass  possible Left Oopherectomy ;  Surgeon: Eldred Manges, MD;  Location: [facility];  Service: Gynecology;  Laterality: N/A;  . BILATERAL SALPINGOOPHORECTOMY  03/21/2014   Benign  . WISDOM TOOTH EXTRACTION     Family History  Problem Relation Age of Onset  . Diabetes Mother   . Heart disease Mother   . Lung cancer Father   . Diabetes Son   . Autism Son   . Heart disease Maternal Grandmother   . Heart disease Paternal Grandmother   . Diabetes Son   . Colon cancer Neg Hx   . Esophageal cancer Neg Hx   . Rectal cancer Neg Hx   . Stomach cancer Neg Hx    Social History   Social History Narrative   Marital status/children/pets:  Married, 3 children (set of twins).  Husband is an internal medicine doctor and a solo practice in [place].       All past medical history, surgical history, allergies, family history, immunizations andmedications were updated in the EMR today and reviewed under the history and medication portions of their EMR.     No results found.   ROS: 14 pt review of systems performed and negative (unless mentioned in an HPI)  Objective: BP 118/82 (BP Location: Right Arm, Patient Position: Sitting, Cuff Size: Normal)   Pulse 86   Temp 97.8 F (36.6 C) (Temporal)   Resp 18   Ht 5' 4" (1.626 m)   Wt 163 lb 8 oz (74.2 kg)   LMP 01/23/2014   SpO2 98%   BMI 28.06 kg/m  Gen: Afebrile. No acute distress.  Nontoxic in presentation, well-developed, well-nourished, very pleasant female. HENT: AT. [place].  Eyes:Pupils Equal Round Reactive to light, Extraocular movements intact,  Conjunctiva without redness, discharge or icterus. Neck/lymp/endocrine: Supple, no lymphadenopathy, no thyromegaly CV: RRR no murmur, no edema, +2/4 P posterior tibialis pulses Chest: CTAB, no wheeze or crackles Skin: No rashes, purpura or petechiae.  Neuro:  Normal gait. PERLA. EOMi. Alert. Oriented x3  Psych: Normal affect, dress and demeanor. Normal speech. Normal thought content and judgment..    Assessment/plan: Melanie Mills is a 54 y.o. female present for est/cpe/[facility] Diabetes mellitus without complication (HCC)/overweight Blood sugar reports look much improved with the addition of glipizide and the increase in Metformin.  She has not tapered up on her Lantus as of yet since her sugars appear to be better controlled.  She is feeling much improved. Continue  Lantus 26 units daily.   Continue glip 5 mg BID.  Continue metformin 1000 BID.   Hypothyroidism, unspecified type Patient reports compliance with the lower dose levothyroxine 125 mcg daily.   Thyroid panel collected today.  Refills will be provided or dose adjusted  depending upon results.  Hyperlipidemia associated with type 2 diabetes: Atorvastatin caused myalgias.  She is agreeable to try pravastatin today. Repeat LFT and lipids at next follow-up appointment if she remains on a statin.  Return in about 3 months (around 03/05/2020) for CMC (30 min).  Orders Placed This Encounter  Procedures  . Comp Met (CMET)  . TSH  . T4, free  . POCT glycosylated hemoglobin (Hb A1C)   Meds ordered this encounter  Medications  . metFORMIN (GLUCOPHAGE) 1000 MG tablet    Sig: Take 1 tablet (1,000 mg total) by mouth 2 (two) times daily with a meal.    Dispense:  60 tablet    Refill:  5    May dispense as 90 days with 1 refill if patient desires and more cost effective.  Marland Kitchen glipiZIDE (GLUCOTROL) 5 MG tablet    Sig: Take 1 tablet (5 mg total) by mouth 2 (two) times daily before a meal.    Dispense:  180 tablet  Refill:  1    May dispense as 90-day with 1 refill if patient desires.  . pravastatin (PRAVACHOL) 10 MG tablet    Sig: Take 1 tablet (10 mg total) by mouth daily.    Dispense:  90 tablet    Refill:  3    DC lipitor  . insulin glargine (LANTUS) 100 UNIT/ML injection    Sig: Inject 0.26-0.3 mLs (26-30 Units total) into the skin daily.    Dispense:  10 mL    Refill:  1   Referral Orders  No referral(s) requested today     Note is dictated utilizing voice recognition software. Although note has been proof read prior to signing, occasional typographical errors still can be missed. If any questions arise, please do not hesitate to call for verification.  Electronically signed by: Howard Pouch, DO [place]

## 2019-12-05 ENCOUNTER — Telehealth: Payer: Self-pay | Admitting: Family Medicine

## 2019-12-05 LAB — COMPREHENSIVE METABOLIC PANEL
AG Ratio: 1.6 (calc) (ref 1.0–2.5)
ALT: 19 U/L (ref 6–29)
AST: 23 U/L (ref 10–35)
Albumin: 4.3 g/dL (ref 3.6–5.1)
Alkaline phosphatase (APISO): 58 U/L (ref 37–153)
BUN: 13 mg/dL (ref 7–25)
CO2: 22 mmol/L (ref 20–32)
Calcium: 9.7 mg/dL (ref 8.6–10.4)
Chloride: 107 mmol/L (ref 98–110)
Creat: 0.85 mg/dL (ref 0.50–1.05)
Globulin: 2.7 g/dL (calc) (ref 1.9–3.7)
Glucose, Bld: 54 mg/dL — ABNORMAL LOW (ref 65–99)
Potassium: 4.1 mmol/L (ref 3.5–5.3)
Sodium: 141 mmol/L (ref 135–146)
Total Bilirubin: 0.4 mg/dL (ref 0.2–1.2)
Total Protein: 7 g/dL (ref 6.1–8.1)

## 2019-12-05 LAB — TSH: TSH: 0.62 mIU/L

## 2019-12-05 LAB — T4, FREE: Free T4: 1.2 ng/dL (ref 0.8–1.8)

## 2019-12-05 MED ORDER — LEVOTHYROXINE SODIUM 125 MCG PO TABS: 125.0000 ug | ORAL_TABLET | Freq: Every day | ORAL | 3 refills | Status: DC

## 2019-12-05 NOTE — Telephone Encounter (Signed)
Please inform patient the following information: Liver, kidney and thyroid are in normal range. I have refilled her thyroid medication at current dose.  Her glucose (54) was a little low at her appt. Monitor fasting in the morning daily  And keep a record. Also, check glucose if any symptoms of low or high sugars. If having repeat lows routinely <70 call in for instruction.

## 2019-12-05 NOTE — Telephone Encounter (Signed)
Pt was called and given information, she verbalized understanding  

## 2020-01-10 ENCOUNTER — Telehealth: Payer: Self-pay

## 2020-01-10 NOTE — Telephone Encounter (Signed)
She has received a call that she needs to fly to [place] for family emergency. Airport told her (which she has done this before) she would have to have a letter from her doctor with a list of her medications and because of the flight time she would have to take syringes and needles and lantus on plane. Patient said you could call her to get specific instructions if needed.  blood glucose meter kit and supplies [[id number]]  glipiZIDE (GLUCOTROL) 5 MG tablet [[id number]]  insulin glargine (LANTUS) 100 UNIT/ML injection [[id number]]  levothyroxine (SYNTHROID) 125 MCG tablet [[id number]]  metFORMIN (GLUCOPHAGE) 1000 MG tablet [[id number]]  Multiple Vitamin (MULTIVITAMIN) tablet [[id number]]  pravastatin (PRAVACHOL) 10 MG tablet [[id number]]    Patient is aware Dr. Raoul Pitch is aware out of office until tomorrow, please have letter ready for Friday. She is trying to leave on Friday to [place].  Patient can be reached tomorrow at [phone].

## 2020-01-11 NOTE — Telephone Encounter (Signed)
Please advise if okay to draft letter stating she must carry diabetic supplies when traveling. Medication list was printed and can be attached to letter.

## 2020-01-11 NOTE — Telephone Encounter (Signed)
Okay to provide letter of necessity for patient's medications during her flight.

## 2020-01-11 NOTE — Telephone Encounter (Signed)
Letter was printed with med list and placed up front for pick up. Pt was called and told it she could come and pick it up.

## 2020-06-07 ENCOUNTER — Encounter: Payer: 59 | Admitting: Family Medicine

## 2020-08-22 ENCOUNTER — Other Ambulatory Visit: Payer: Self-pay

## 2020-08-23 ENCOUNTER — Encounter: Payer: Self-pay | Admitting: Family Medicine

## 2020-08-23 ENCOUNTER — Ambulatory Visit (INDEPENDENT_AMBULATORY_CARE_PROVIDER_SITE_OTHER): Payer: 59 | Admitting: Family Medicine

## 2020-08-23 VITALS — BP 120/84 | HR 98 | Temp 98.5°F | Ht 63.75 in | Wt 163.0 lb

## 2020-08-23 DIAGNOSIS — Z1231 Encounter for screening mammogram for malignant neoplasm of breast: Secondary | ICD-10-CM

## 2020-08-23 DIAGNOSIS — Z2821 Immunization not carried out because of patient refusal: Secondary | ICD-10-CM

## 2020-08-23 DIAGNOSIS — D569 Thalassemia, unspecified: Secondary | ICD-10-CM | POA: Diagnosis not present

## 2020-08-23 DIAGNOSIS — E1169 Type 2 diabetes mellitus with other specified complication: Secondary | ICD-10-CM | POA: Diagnosis not present

## 2020-08-23 DIAGNOSIS — E663 Overweight: Secondary | ICD-10-CM | POA: Diagnosis not present

## 2020-08-23 DIAGNOSIS — E785 Hyperlipidemia, unspecified: Secondary | ICD-10-CM

## 2020-08-23 DIAGNOSIS — E119 Type 2 diabetes mellitus without complications: Secondary | ICD-10-CM | POA: Diagnosis not present

## 2020-08-23 DIAGNOSIS — Z0000 Encounter for general adult medical examination without abnormal findings: Secondary | ICD-10-CM | POA: Diagnosis not present

## 2020-08-23 DIAGNOSIS — E038 Other specified hypothyroidism: Secondary | ICD-10-CM

## 2020-08-23 DIAGNOSIS — Z5320 Procedure and treatment not carried out because of patient's decision for unspecified reasons: Secondary | ICD-10-CM | POA: Insufficient documentation

## 2020-08-23 DIAGNOSIS — E063 Autoimmune thyroiditis: Secondary | ICD-10-CM

## 2020-08-23 MED ORDER — METFORMIN HCL 1000 MG PO TABS: 1000.0000 mg | ORAL_TABLET | Freq: Two times a day (BID) | ORAL | 5 refills | Status: AC

## 2020-08-23 MED ORDER — PRAVASTATIN SODIUM 10 MG PO TABS: 10.0000 mg | ORAL_TABLET | Freq: Every day | ORAL | 3 refills | Status: AC

## 2020-08-23 NOTE — Progress Notes (Signed)
 This visit occurred during the SARS-CoV-2 public health emergency.  Safety protocols were in place, including screening questions prior to the visit, additional usage of staff PPE, and extensive cleaning of exam room while observing appropriate contact time as indicated for disinfecting solutions.    Patient ID: Melanie Mills, female  DOB: 07/07/1965, 54 y.o.   MRN: [id number] Patient Care Team    Relationship Specialty Notifications Start End  Kuneff, Renee A, DO PCP - General Family Medicine  08/18/19   Gupta, Rajesh, MD Consulting Physician Gastroenterology  09/26/19     Chief Complaint  Patient presents with  . Annual Exam    Pt is not fasting    Subjective:  Melanie Mills is a 54 y.o.  Female  present for CPE/CMC. All past medical history, surgical history, allergies, family history, immunizations, medications and social history were updated in the electronic medical record today. All recent labs, ED visits and hospitalizations within the last year were reviewed.  Health maintenance:  Colonoscopy: completed by Dr. Gupta.  10/25/2019- 10 yr follow up Mammogram: completed: completed 09/29/2019- [facility]. Cervical cancer screening: Total hysterectomy- N/A Immunizations: tdap UTD 2015, Influenza up-to-date 2022 (encouraged yearly), shingles vaccination declined today.  Pneumonia vaccine declined (diabetes) Infectious disease screening: HIV screening declined.Hep C declined DEXA: Routine screening consider starting at 60-65 Assistive device: none Oxygen use:none Patient has a Dental home. Hospitalizations/ED visits: reviewed  Diabetes type 2/hyperlipidemia: Diagnosis approximately 09/2018.  Pt reports compliance e with metformin 1000 mg twice daily, glipizide 5 mg about 3x a week and Lantus 26 units nightly.  He has had a hypoglycemic one-time over the last 3 months in the middle of the night.  Patient denies dizziness, hyperglycemic or hypoglycemic events. Patient  denies numbness, tingling in the extremities or nonhealing wounds of feet.  most blood sugars are in the 120-130 range. PNA series: Declined  Flu shot: Up-to-date 2020 (recommneded yearly) Foot exam: completed 08/23/2020 Eye exam: Completed 11/17/2019 A1c: Patient reported initial A1c of 12, A1c 08/21/2019 11.4.> 8.0> collected today.  Hypothyroid: Patient reports she has had a hypothyroid condition for about 20 years.  Patient's reports compliance  levothyroxine 125 mcg.  Depression screen PHQ 2/9 08/23/2020 08/23/2020 08/18/2019  Decreased Interest 0 0 1  Down, Depressed, Hopeless 0 0 1  PHQ - 2 Score 0 0 2  Altered sleeping - - 0  Tired, decreased energy - - 2  Change in appetite - - 0  Feeling bad or failure about yourself  - - 0  Trouble concentrating - - 0  Moving slowly or fidgety/restless - - 0  Suicidal thoughts - - 0  PHQ-9 Score - - 4  Difficult doing work/chores - - Not difficult at all   No flowsheet data found.       Immunization History  Administered Date(s) Administered  . Influenza-Unspecified 03/30/2019, 06/29/2020  . Moderna Sars-Covid-2 Vaccination 07/28/2019, 08/23/2019, 05/16/2020  . Tdap 06/29/2013     Past Medical History:  Diagnosis Date  . Anemia   . Asthma   . 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 Chicken pox   . Diabetes (HCC) 09/2018  . Endometriosis of fallopian tube 03/22/2014  . Fibroids, intramural 03/21/2014  . History of BCG vaccination   . History of frequent urinary tract infections   . Hyperlipidemia   . Hypothyroidism 2000  . 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 Occlusion of breast duct 08/18/2019  . Right ovarian cyst 03/21/2014   Benign.  . Thalassemia    Allergies    Allergen Reactions  . Orange Juice [Orange Oil]     Throat itches     Past Surgical History:  Procedure Laterality Date  . ABDOMINAL HYSTERECTOMY N/A 03/21/2014   Procedure: Abdominal Hysterectomy, Bilateral Salpingectomy,Bilateralt Oopherectomies, excision of  Right Tubal Mass  possible Left Oopherectomy ;  Surgeon: Vanessa P Haygood, MD;  Location: [facility];  Service: Gynecology;  Laterality: N/A;  . BILATERAL SALPINGOOPHORECTOMY  03/21/2014   Benign  . WISDOM TOOTH EXTRACTION     Family History  Problem Relation Age of Onset  . Diabetes Mother   . Heart disease Mother   . Lung cancer Father   . Diabetes Son   . Autism Son   . Heart disease Maternal Grandmother   . Heart disease Paternal Grandmother   . Diabetes Son   . Colon cancer Neg Hx   . Esophageal cancer Neg Hx   . Rectal cancer Neg Hx   . Stomach cancer Neg Hx    Social History   Social History Narrative   Marital status/children/pets: Married, 3 children (set of twins).  Husband is an internal medicine doctor and a solo practice in [place].       Allergies as of 08/23/2020      Reactions   Orange Juice [orange Oil]    Throat itches       Medication List       Accurate as of August 23, 2020  2:15 PM. If you have any questions, ask your nurse or doctor.        blood glucose meter kit and supplies Dispense based on patient and insurance preference. Use up to four times daily as directed. E11.9   glipiZIDE 5 MG tablet Commonly known as: GLUCOTROL Take 1 tablet (5 mg total) by mouth 2 (two) times daily before a meal.   insulin glargine 100 UNIT/ML injection Commonly known as: LANTUS Inject 0.26-0.3 mLs (26-30 Units total) into the skin daily.   levothyroxine 125 MCG tablet Commonly known as: SYNTHROID Take 1 tablet (125 mcg total) by mouth daily before breakfast.   metFORMIN 1000 MG tablet Commonly known as: GLUCOPHAGE Take 1 tablet (1,000 mg total) by mouth 2 (two) times daily with a meal.   multivitamin tablet Take 1 tablet by mouth daily.   pravastatin 10 MG tablet Commonly known as: PRAVACHOL Take 1 tablet (10 mg total) by mouth daily.       All past medical history, surgical history, allergies, family history, immunizations andmedications were  updated in the EMR today and reviewed under the history and medication portions of their EMR.     No results found for this or any previous visit (from the past 2160 hour(s)).    MM DIAG BREAST TOMO UNI LEFT Result Date: 10/20/2019 RECOMMENDATION: Screening mammogram in one year.(Code:[id number]-01Y) I have discussed the findings and recommendations with the patient. If applicable, a reminder letter will be sent to the patient regarding the next appointment. BI-RADS CATEGORY  2: Benign. Electronically Signed   By: Drew  Davis M.D.   On: 10/20/2019 15:24     ROS: 14 pt review of systems performed and negative (unless mentioned in an HPI)  Objective: BP 120/84   Pulse 98   Temp 98.5 F (36.9 C) (Oral)   Ht 5' 3.75" (1.619 m)   Wt 163 lb (73.9 kg)   LMP 01/23/2014   SpO2 98%   BMI 28.20 kg/m  Gen: Afebrile. No acute distress. Nontoxic in appearance, well-developed, well-nourished, very pleasant female. HENT: AT.   [place]. Bilateral TM visualized and normal in appearance, normal external auditory canal. MMM, no oral lesions, adequate dentition. Bilateral nares within normal limits. Throat without erythema, ulcerations or exudates.  No cough on exam, no hoarseness on exam. Eyes:Pupils Equal Round Reactive to light, Extraocular movements intact,  Conjunctiva without redness, discharge or icterus. Neck/lymp/endocrine: Supple, no lymphadenopathy, no thyromegaly CV: RRR no murmur, no edema, +2/4 P posterior tibialis pulses.  Chest: CTAB, no wheeze, rhonchi or crackles.  Normal respiratory effort.  Good air movement. Abd: Soft.  Flat. NTND. BS present.  No masses palpated. No hepatosplenomegaly. No rebound tenderness or guarding. Skin: No rashes, purpura or petechiae. Warm and well-perfused. Skin intact. Neuro/Msk:  Normal gait. PERLA. EOMi. Alert. Oriented x3.  Cranial nerves II through XII intact. Muscle strength 5/5 upper/lower extremity. DTRs equal bilaterally. Psych: Normal affect, dress and demeanor.  Normal speech. Normal thought content and judgment.   No exam data present  Assessment/plan: Melanie Mills is a 55 y.o. female present for CPE/CMC Diabetes mellitus without complication (HCC)/overweight Lost to follow up- last seen>8 months ago.  continue   Lantus 26 units daily.   Not taking glipizide routinely d/t low sugars reported> will decide on continuation after results.  Continue metformin 1000 BID.  - CBC with Differential/Platelet - Comprehensive metabolic panel - Hemoglobin A1c - Microalbumin / creatinine urine ratio  Hypothyroidism, unspecified type  levothyroxine 125 mcg daily.   Tsh, t4 free  Hyperlipidemia associated with type 2 diabetes:  continue (restart) pravastatin - CBC with Differential/Platelet - Comprehensive metabolic panel - Lipid panel - T4, free - TSH  Thalassemia, unspecified type - CBC with Differential/Platelet - Comprehensive metabolic panel Breast cancer screening by mammogram - MM Digital Screening; Future Screening for hepatitis C declined HIV screening declined Pneumococcal vaccination declined   Routine general medical examination at a health care facility Patient was encouraged to exercise greater than 150 minutes a week. Patient was encouraged to choose a diet filled with fresh fruits and vegetables, and lean meats. AVS provided to patient today for education/recommendation on gender specific health and safety maintenance. Colonoscopy: completed by Dr. Lyndel Safe.  10/25/2019- 10 yr follow up Mammogram: completed: [facility]. Cervical cancer screening: Total hysterectomy- N/A Immunizations: tdap UTD 2015, Influenza up-to-date 2022 (encouraged yearly), shingles vaccination declined today.  Pneumonia vaccine declined (diabetes) Infectious disease screening: HIV screening declined.Hep C declined DEXA: Routine screening consider starting at 60-65  No follow-ups on file.  No orders of the defined types  were placed in this encounter.   No orders of the defined types were placed in this encounter.  No orders of the defined types were placed in this encounter.  Referral Orders  No referral(s) requested today     Electronically signed by: Howard Pouch, [place]

## 2020-08-23 NOTE — Patient Instructions (Signed)
Health Maintenance, Female Adopting a healthy lifestyle and getting preventive care are important in promoting health and wellness. Ask your health care provider about:  The right schedule for you to have regular tests and exams.  Things you can do on your own to prevent diseases and keep yourself healthy. What should I know about diet, weight, and exercise? Eat a healthy diet  Eat a diet that includes plenty of vegetables, fruits, low-fat dairy products, and lean protein.  Do not eat a lot of foods that are high in solid fats, added sugars, or sodium.   Maintain a healthy weight Body mass index (BMI) is used to identify weight problems. It estimates body fat based on height and weight. Your health care provider can help determine your BMI and help you achieve or maintain a healthy weight. Get regular exercise Get regular exercise. This is one of the most important things you can do for your health. Most adults should:  Exercise for at least 150 minutes each week. The exercise should increase your heart rate and make you sweat (moderate-intensity exercise).  Do strengthening exercises at least twice a week. This is in addition to the moderate-intensity exercise.  Spend less time sitting. Even light physical activity can be beneficial. Watch cholesterol and blood lipids Have your blood tested for lipids and cholesterol at 53 years of age, then have this test every 5 years. Have your cholesterol levels checked more often if:  Your lipid or cholesterol levels are high.  You are older than 53 years of age.  You are at high risk for heart disease. What should I know about cancer screening? Depending on your health history and family history, you may need to have cancer screening at various ages. This may include screening for:  Breast cancer.  Cervical cancer.  Colorectal cancer.  Skin cancer.  Lung cancer. What should I know about heart disease, diabetes, and high blood  pressure? Blood pressure and heart disease  High blood pressure causes heart disease and increases the risk of stroke. This is more likely to develop in people who have high blood pressure readings, are of African descent, or are overweight.  Have your blood pressure checked: ? Every 3-5 years if you are 18-39 years of age. ? Every year if you are 40 years old or older. Diabetes Have regular diabetes screenings. This checks your fasting blood sugar level. Have the screening done:  Once every three years after age 40 if you are at a normal weight and have a low risk for diabetes.  More often and at a younger age if you are overweight or have a high risk for diabetes. What should I know about preventing infection? Hepatitis B If you have a higher risk for hepatitis B, you should be screened for this virus. Talk with your health care provider to find out if you are at risk for hepatitis B infection. Hepatitis C Testing is recommended for:  Everyone born from 1945 through 1965.  Anyone with known risk factors for hepatitis C. Sexually transmitted infections (STIs)  Get screened for STIs, including gonorrhea and chlamydia, if: ? You are sexually active and are younger than 53 years of age. ? You are older than 53 years of age and your health care provider tells you that you are at risk for this type of infection. ? Your sexual activity has changed since you were last screened, and you are at increased risk for chlamydia or gonorrhea. Ask your health care provider   if you are at risk.  Ask your health care provider about whether you are at high risk for HIV. Your health care provider may recommend a prescription medicine to help prevent HIV infection. If you choose to take medicine to prevent HIV, you should first get tested for HIV. You should then be tested every 3 months for as long as you are taking the medicine. Pregnancy  If you are about to stop having your period (premenopausal) and  you may become pregnant, seek counseling before you get pregnant.  Take 400 to 800 micrograms (mcg) of folic acid every day if you become pregnant.  Ask for birth control (contraception) if you want to prevent pregnancy. Osteoporosis and menopause Osteoporosis is a disease in which the bones lose minerals and strength with aging. This can result in bone fractures. If you are 65 years old or older, or if you are at risk for osteoporosis and fractures, ask your health care provider if you should:  Be screened for bone loss.  Take a calcium or vitamin D supplement to lower your risk of fractures.  Be given hormone replacement therapy (HRT) to treat symptoms of menopause. Follow these instructions at home: Lifestyle  Do not use any products that contain nicotine or tobacco, such as cigarettes, e-cigarettes, and chewing tobacco. If you need help quitting, ask your health care provider.  Do not use street drugs.  Do not share needles.  Ask your health care provider for help if you need support or information about quitting drugs. Alcohol use  Do not drink alcohol if: ? Your health care provider tells you not to drink. ? You are pregnant, may be pregnant, or are planning to become pregnant.  If you drink alcohol: ? Limit how much you use to 0-1 drink a day. ? Limit intake if you are breastfeeding.  Be aware of how much alcohol is in your drink. In the [place] one drink equals one 12 oz bottle of beer (355 mL), one 5 oz glass of wine (148 mL), or one 1 oz glass of hard liquor (44 mL). General instructions  Schedule regular health, dental, and eye exams.  Stay current with your vaccines.  Tell your health care provider if: ? You often feel depressed. ? You have ever been abused or do not feel safe at home. Summary  Adopting a healthy lifestyle and getting preventive care are important in promoting health and wellness.  Follow your health care provider's instructions about healthy  diet, exercising, and getting tested or screened for diseases.  Follow your health care provider's instructions on monitoring your cholesterol and blood pressure. This information is not intended to replace advice given to you by your health care provider. Make sure you discuss any questions you have with your health care provider. Document Revised: 06/08/2018 Document Reviewed: 06/08/2018 Elsevier Patient Education  2021 Elsevier Inc.  

## 2020-08-24 LAB — LIPID PANEL
Cholesterol: 166 mg/dL (ref <200)
HDL: 47 mg/dL — ABNORMAL LOW (ref >=50)
LDL Cholesterol (Calc): 98 mg/dL (calc)
Non-HDL Cholesterol (Calc): 119 mg/dL (calc) (ref <130)
Total CHOL/HDL Ratio: 3.5 (calc) (ref <5.0)
Triglycerides: 111 mg/dL (ref <150)

## 2020-08-24 LAB — COMPREHENSIVE METABOLIC PANEL
AG Ratio: 1.5 (calc) (ref 1.0–2.5)
ALT: 20 U/L (ref 6–29)
AST: 20 U/L (ref 10–35)
Albumin: 4.2 g/dL (ref 3.6–5.1)
Alkaline phosphatase (APISO): 52 U/L (ref 37–153)
BUN: 10 mg/dL (ref 7–25)
CO2: 22 mmol/L (ref 20–32)
Calcium: 9.4 mg/dL (ref 8.6–10.4)
Chloride: 104 mmol/L (ref 98–110)
Creat: 0.77 mg/dL (ref 0.50–1.05)
Globulin: 2.8 g/dL (calc) (ref 1.9–3.7)
Glucose, Bld: 167 mg/dL — ABNORMAL HIGH (ref 65–99)
Potassium: 4.3 mmol/L (ref 3.5–5.3)
Sodium: 140 mmol/L (ref 135–146)
Total Bilirubin: 0.4 mg/dL (ref 0.2–1.2)
Total Protein: 7 g/dL (ref 6.1–8.1)

## 2020-08-24 LAB — CBC WITH DIFFERENTIAL/PLATELET
Absolute Monocytes: 327 cells/uL (ref 200–950)
Basophils Absolute: 23 cells/uL (ref 0–200)
Basophils Relative: 0.3 %
Eosinophils Absolute: 129 cells/uL (ref 15–500)
Eosinophils Relative: 1.7 %
HCT: 38.4 % (ref 35.0–45.0)
Hemoglobin: 12 g/dL (ref 11.7–15.5)
Lymphs Abs: 3215 cells/uL (ref 850–3900)
MCH: 20.3 pg — ABNORMAL LOW (ref 27.0–33.0)
MCHC: 31.3 g/dL — ABNORMAL LOW (ref 32.0–36.0)
MCV: 64.9 fL — ABNORMAL LOW (ref 80.0–100.0)
MPV: 11.3 fL (ref 7.5–12.5)
Monocytes Relative: 4.3 %
Neutro Abs: 3906 cells/uL (ref 1500–7800)
Neutrophils Relative %: 51.4 %
Platelets: 310 10*3/uL (ref 140–400)
RBC: 5.92 10*6/uL — ABNORMAL HIGH (ref 3.80–5.10)
RDW: 16.3 % — ABNORMAL HIGH (ref 11.0–15.0)
Total Lymphocyte: 42.3 %
WBC: 7.6 10*3/uL (ref 3.8–10.8)

## 2020-08-24 LAB — MICROALBUMIN / CREATININE URINE RATIO
Creatinine, Urine: 63 mg/dL (ref 20–275)
Microalb Creat Ratio: 8 mcg/mg creat (ref <30)
Microalb, Ur: 0.5 mg/dL

## 2020-08-24 LAB — HEMOGLOBIN A1C
Hgb A1c MFr Bld: 10.3 % of total Hgb — ABNORMAL HIGH (ref <5.7)
Mean Plasma Glucose: 249 mg/dL
eAG (mmol/L): 13.8 mmol/L

## 2020-08-24 LAB — T4, FREE: Free T4: 1.5 ng/dL (ref 0.8–1.8)

## 2020-08-24 LAB — TSH: TSH: 2.35 mIU/L

## 2020-08-26 ENCOUNTER — Telehealth: Payer: Self-pay | Admitting: Family Medicine

## 2020-08-26 MED ORDER — SITAGLIPTIN PHOSPHATE 100 MG PO TABS: 100.0000 mg | ORAL_TABLET | Freq: Every day | ORAL | 1 refills | Status: AC

## 2020-08-26 MED ORDER — INSULIN GLARGINE 100 UNIT/ML ~~LOC~~ SOLN: 30.0000 [IU] | Freq: Every day | SUBCUTANEOUS | 5 refills | Status: AC

## 2020-08-26 MED ORDER — LEVOTHYROXINE SODIUM 125 MCG PO TABS
125.0000 ug | ORAL_TABLET | Freq: Every day | ORAL | 3 refills | Status: DC
Start: 2020-08-26 — End: 2021-08-27

## 2020-08-26 NOTE — Telephone Encounter (Signed)
Unable to LVM.

## 2020-08-26 NOTE — Telephone Encounter (Signed)
Please inform patient the following information: - Liver, kidney and thyroid functions are normal.  I have refilled her thyroid medicine. -Her blood cell counts are as expected and stable compared to prior collections - Her cholesterol panel overall looks good.  However giving that she is a diabetic her LDL goal is less than 70 and hers is currently at 98.  I would encourage her to restart the pravastatin. -Urinalysis was normal  Her A1c was 10.3, this is uncontrolled diabetes.  Her glucose was also 167.   -Increase Lantus to 30 units daily   -I discontinued the glipizide since she was not tolerating.   -Called in a new medicine called Januvia to be started daily.  This is usually better tolerated than the glipizide.  Follow-up in 3-4 months for chronic medical conditions and diabetes follow-up

## 2020-08-27 NOTE — Telephone Encounter (Signed)
Unable to LVM due to VM being full

## 2020-08-28 NOTE — Telephone Encounter (Signed)
Unable to LVM, will send letter.

## 2020-12-23 ENCOUNTER — Ambulatory Visit: Payer: 59 | Admitting: Family Medicine

## 2021-03-26 ENCOUNTER — Ambulatory Visit: Payer: Self-pay

## 2021-05-19 ENCOUNTER — Telehealth: Payer: Self-pay | Admitting: *Deleted

## 2021-05-19 NOTE — Telephone Encounter (Signed)
Faxed office notes from 11/24/19 to present per their request,received confirmation.

## 2021-05-19 NOTE — Telephone Encounter (Signed)
Faxed notes to [facility](notes from 11/24/19 to present) per their request-05/19/21

## 2021-08-25 ENCOUNTER — Encounter: Payer: 59 | Admitting: Family Medicine

## 2021-08-27 ENCOUNTER — Other Ambulatory Visit: Payer: Self-pay | Admitting: Family Medicine

## 2021-10-15 IMAGING — MG DIGITAL SCREENING BILAT W/ TOMO W/ CAD
8 series · 8 of 24 positions shown · non-contrast
Comparison: Previous exam(s).

CLINICAL DATA: Screening.

EXAM:
DIGITAL SCREENING BILATERAL MAMMOGRAM WITH TOMO AND CAD

[L MLO synth-2D]
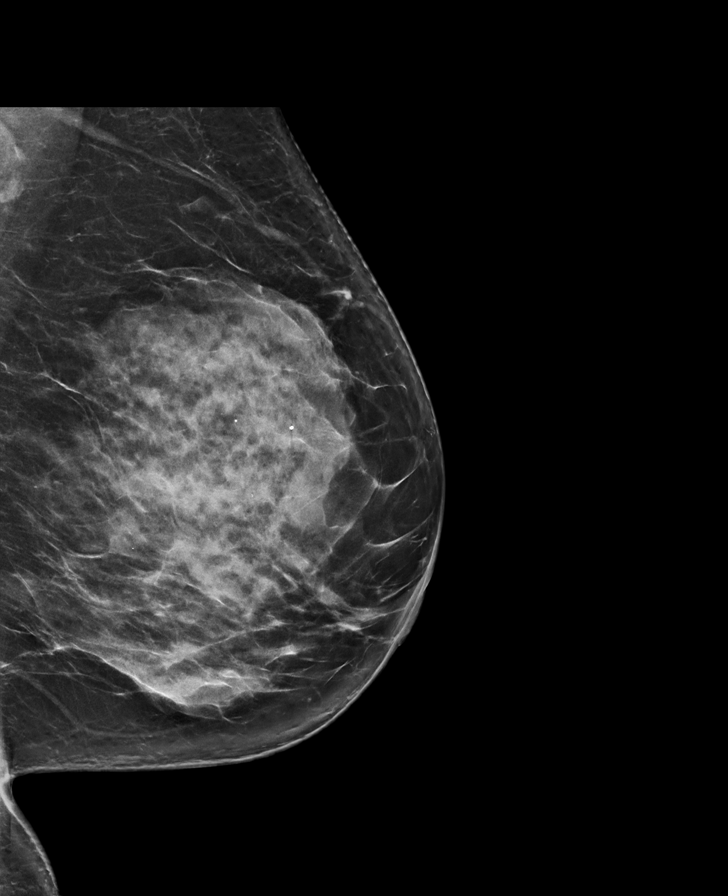

[R CC synth-2D]
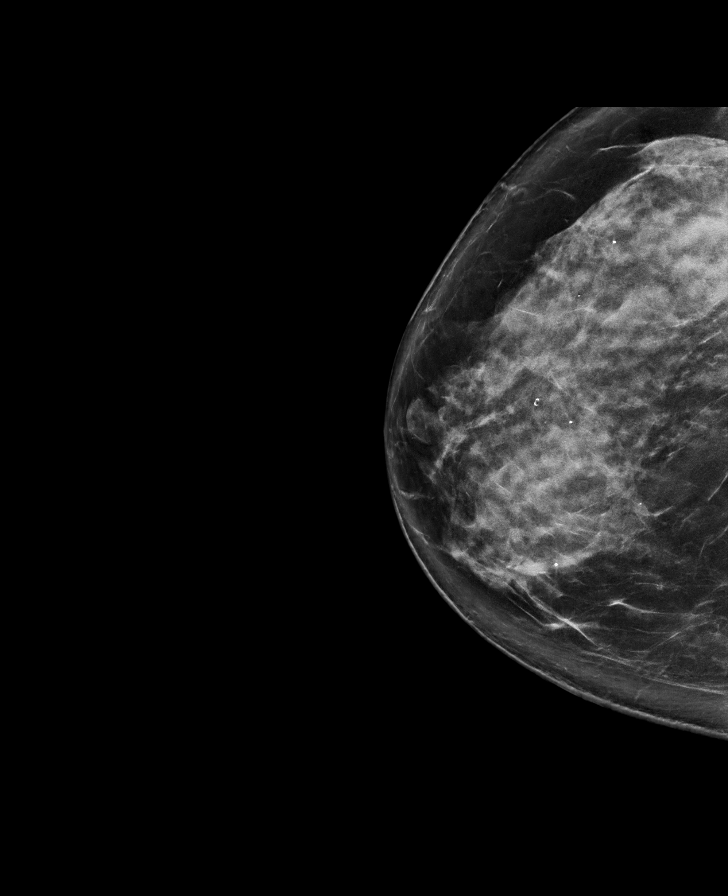

[R MLO synth-2D]
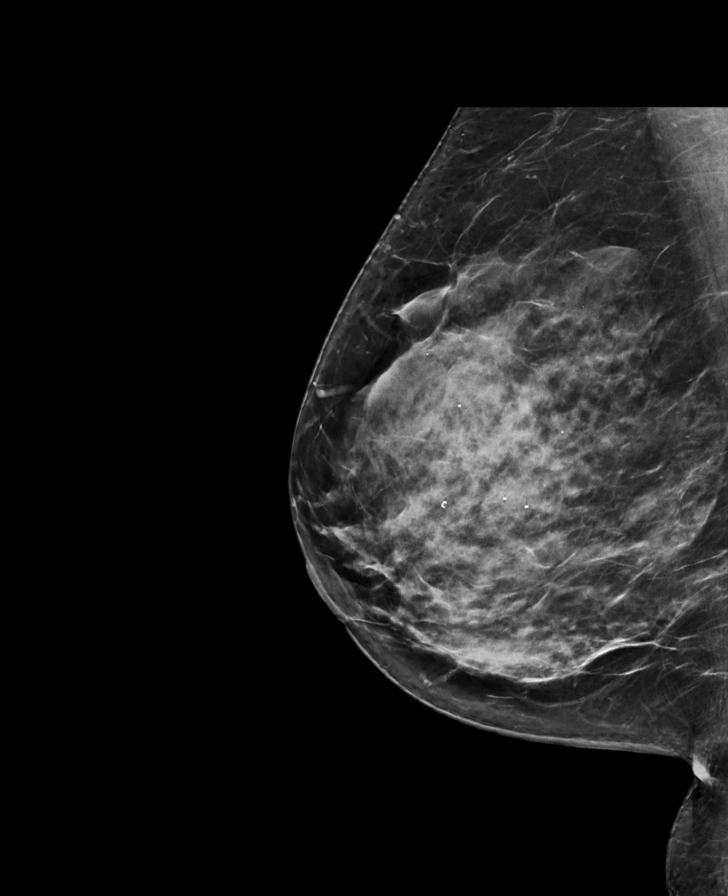

[L CC synth-2D]
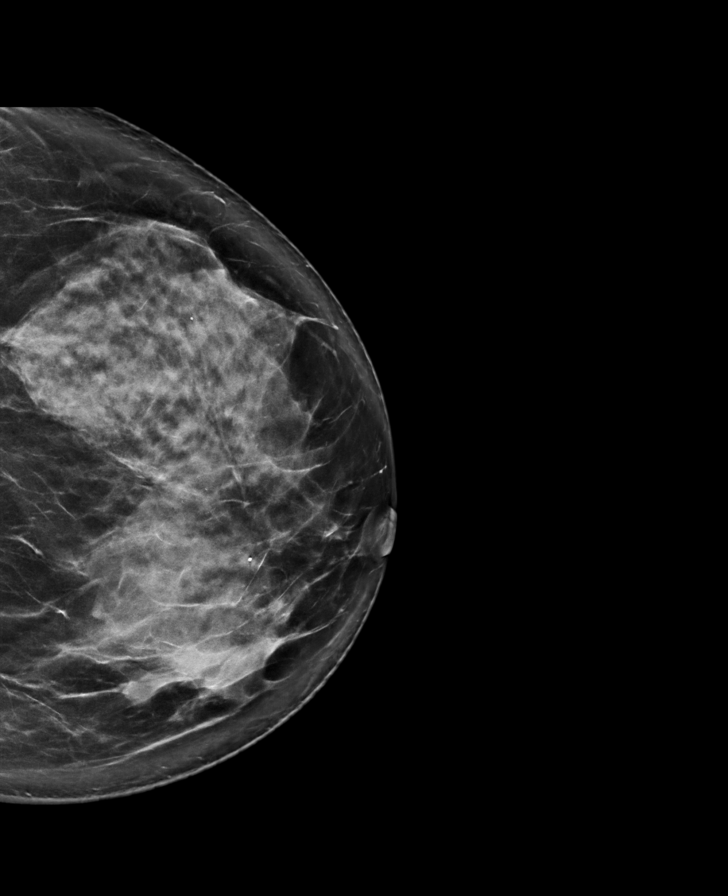

[R MLO tomo · tomo slice 43/84.0]
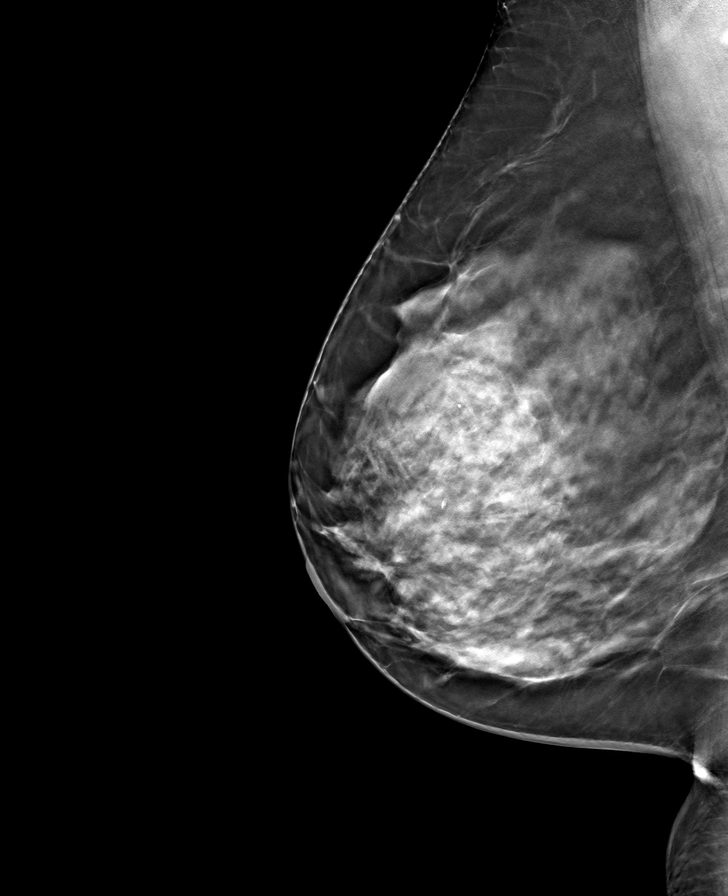

[L CC tomo · tomo slice 45/90.0]
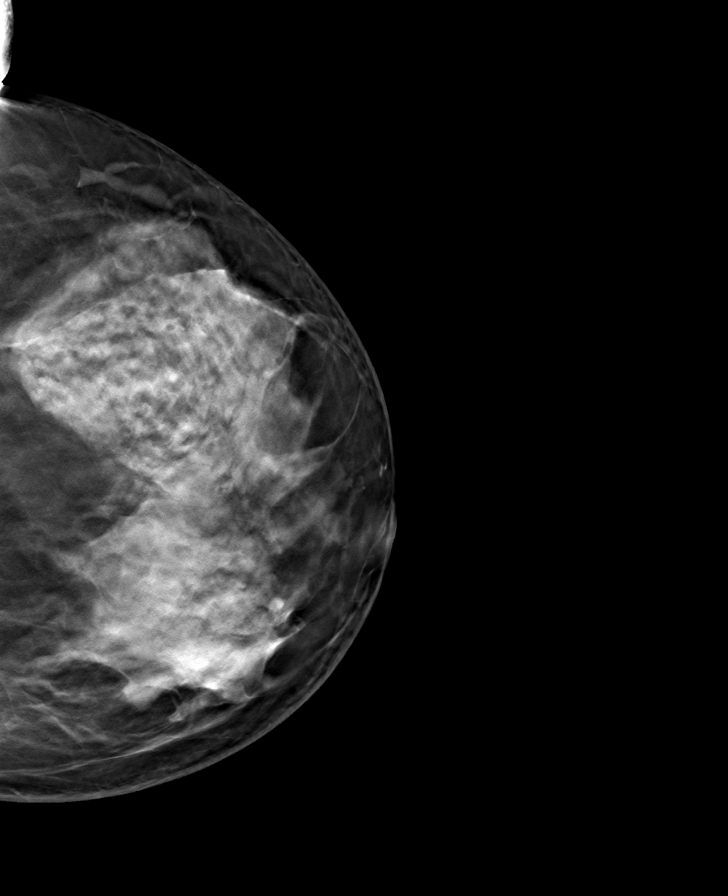

[L MLO tomo · tomo slice 43/86.0]
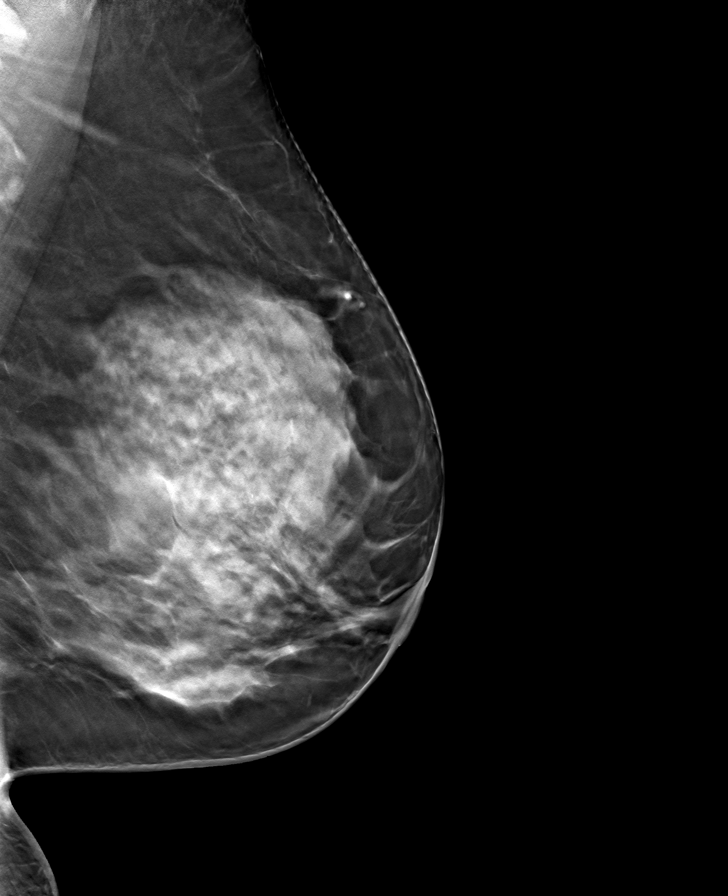

[R CC tomo · tomo slice 47/94.0]
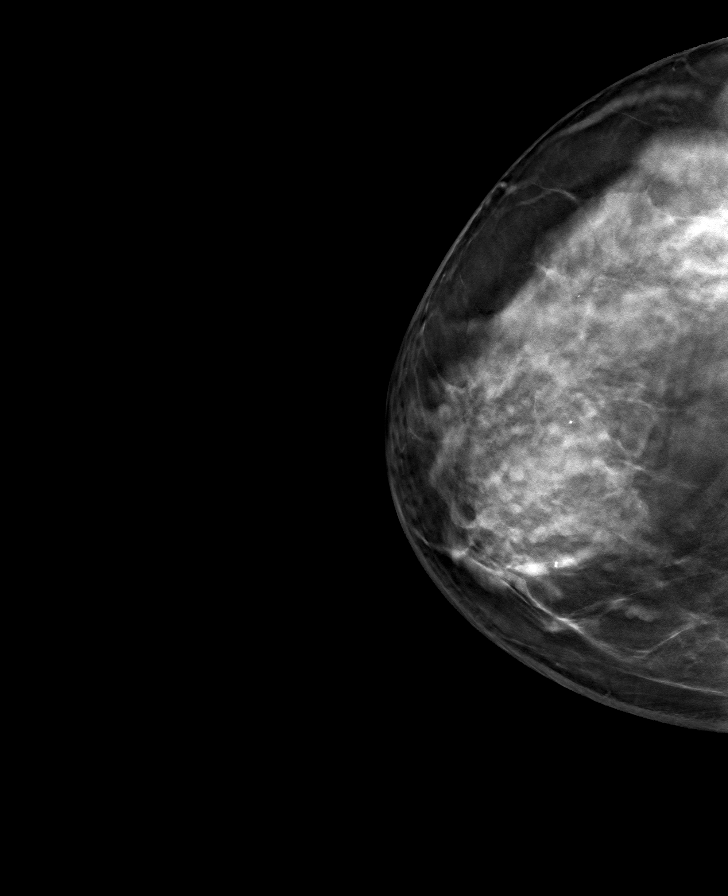

[8 of 24 positions shown; findings below may reference images not displayed]

ACR Breast Density Category c: The breast tissue is heterogeneously
dense, which may obscure small masses.
FINDINGS: In the left breast, a possible asymmetry warrants further
evaluation. In the right breast, no findings suspicious for
malignancy. Images were processed with CAD.
IMPRESSION: Further evaluation is suggested for possible asymmetry in the left
breast.

RECOMMENDATION:
Diagnostic mammogram and possibly ultrasound of the left breast.
(Code:[id number])

The patient will be contacted regarding the findings, and additional
imaging will be scheduled.

BI-RADS CATEGORY  0: Incomplete. Need additional imaging evaluation
and/or prior mammograms for comparison.

## 2022-08-11 ENCOUNTER — Other Ambulatory Visit: Payer: Self-pay | Admitting: Family Medicine

## 2022-08-11 DIAGNOSIS — Z1231 Encounter for screening mammogram for malignant neoplasm of breast: Secondary | ICD-10-CM

## 2023-04-07 LAB — HM DIABETES EYE EXAM
